# Patient Record
Sex: Male | Born: 1960
Health system: Southern US, Community
[De-identification: ages and names within clinical notes are randomized; demographics above are authoritative.]

## PROBLEM LIST (undated history)

## (undated) DIAGNOSIS — E119 Type 2 diabetes mellitus without complications: Secondary | ICD-10-CM

## (undated) DIAGNOSIS — Z973 Presence of spectacles and contact lenses: Secondary | ICD-10-CM

## (undated) DIAGNOSIS — M1990 Unspecified osteoarthritis, unspecified site: Secondary | ICD-10-CM

## (undated) DIAGNOSIS — Z8739 Personal history of other diseases of the musculoskeletal system and connective tissue: Secondary | ICD-10-CM

## (undated) DIAGNOSIS — G4730 Sleep apnea, unspecified: Secondary | ICD-10-CM

## (undated) DIAGNOSIS — Z8709 Personal history of other diseases of the respiratory system: Secondary | ICD-10-CM

## (undated) DIAGNOSIS — R0902 Hypoxemia: Secondary | ICD-10-CM

## (undated) DIAGNOSIS — T7840XA Allergy, unspecified, initial encounter: Secondary | ICD-10-CM

## (undated) HISTORY — DX: Personal history of other diseases of the respiratory system: Z87.09

## (undated) HISTORY — DX: Presence of spectacles and contact lenses: Z97.3

## (undated) HISTORY — DX: Unspecified osteoarthritis, unspecified site: M19.90

## (undated) HISTORY — PX: HARDWARE REMOVAL: SHX979

## (undated) HISTORY — DX: Personal history of other diseases of the musculoskeletal system and connective tissue: Z87.39

## (undated) HISTORY — DX: Allergy, unspecified, initial encounter: T78.40XA

## (undated) HISTORY — DX: Hypoxemia: R09.02

## (undated) HISTORY — DX: Type 2 diabetes mellitus without complications: E11.9

## (undated) HISTORY — PX: HERNIA REPAIR: SHX51

## (undated) HISTORY — PX: OTHER SURGICAL HISTORY: SHX169

## (undated) HISTORY — PX: FRACTURE SURGERY: SHX138

## (undated) HISTORY — DX: Sleep apnea, unspecified: G47.30

---

## 2004-02-06 ENCOUNTER — Emergency Department (HOSPITAL_COMMUNITY): Admission: EM | Admit: 2004-02-06 | Discharge: 2004-02-06 | Payer: Self-pay | Admitting: Emergency Medicine

## 2005-10-02 IMAGING — CR DG SHOULDER 2+V*R*
4 series · 4 of 4 positions shown · non-contrast
Comparison: none

CLINICAL DATA: Right shoulder pain following a fall.

RIGHT SHOULDER - 4 VIEW

[view not recorded (1 of 4)]
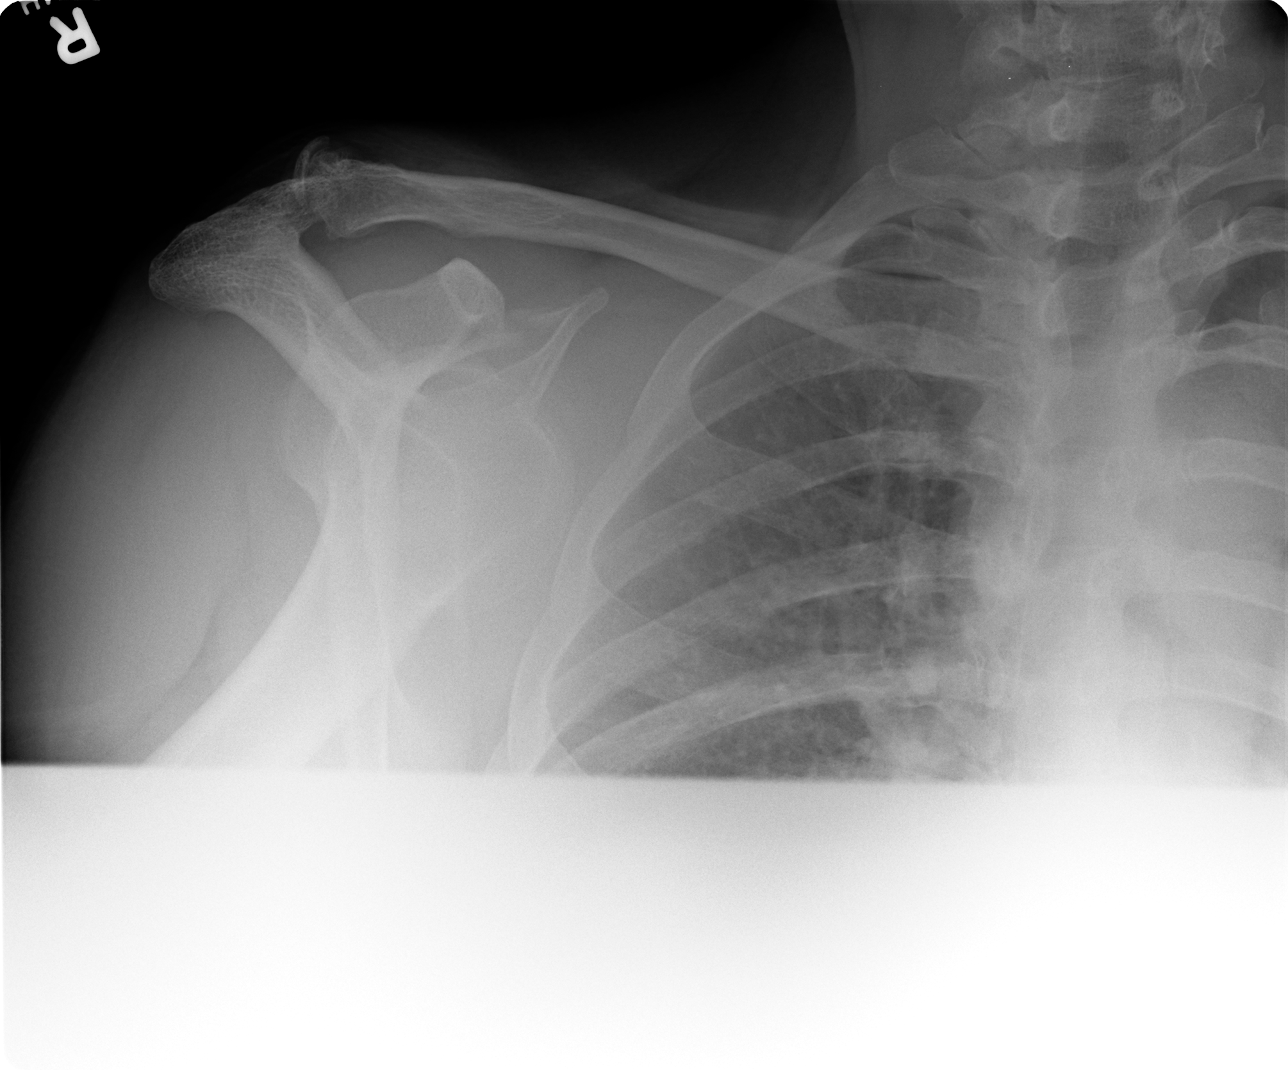

[view not recorded (2 of 4)]
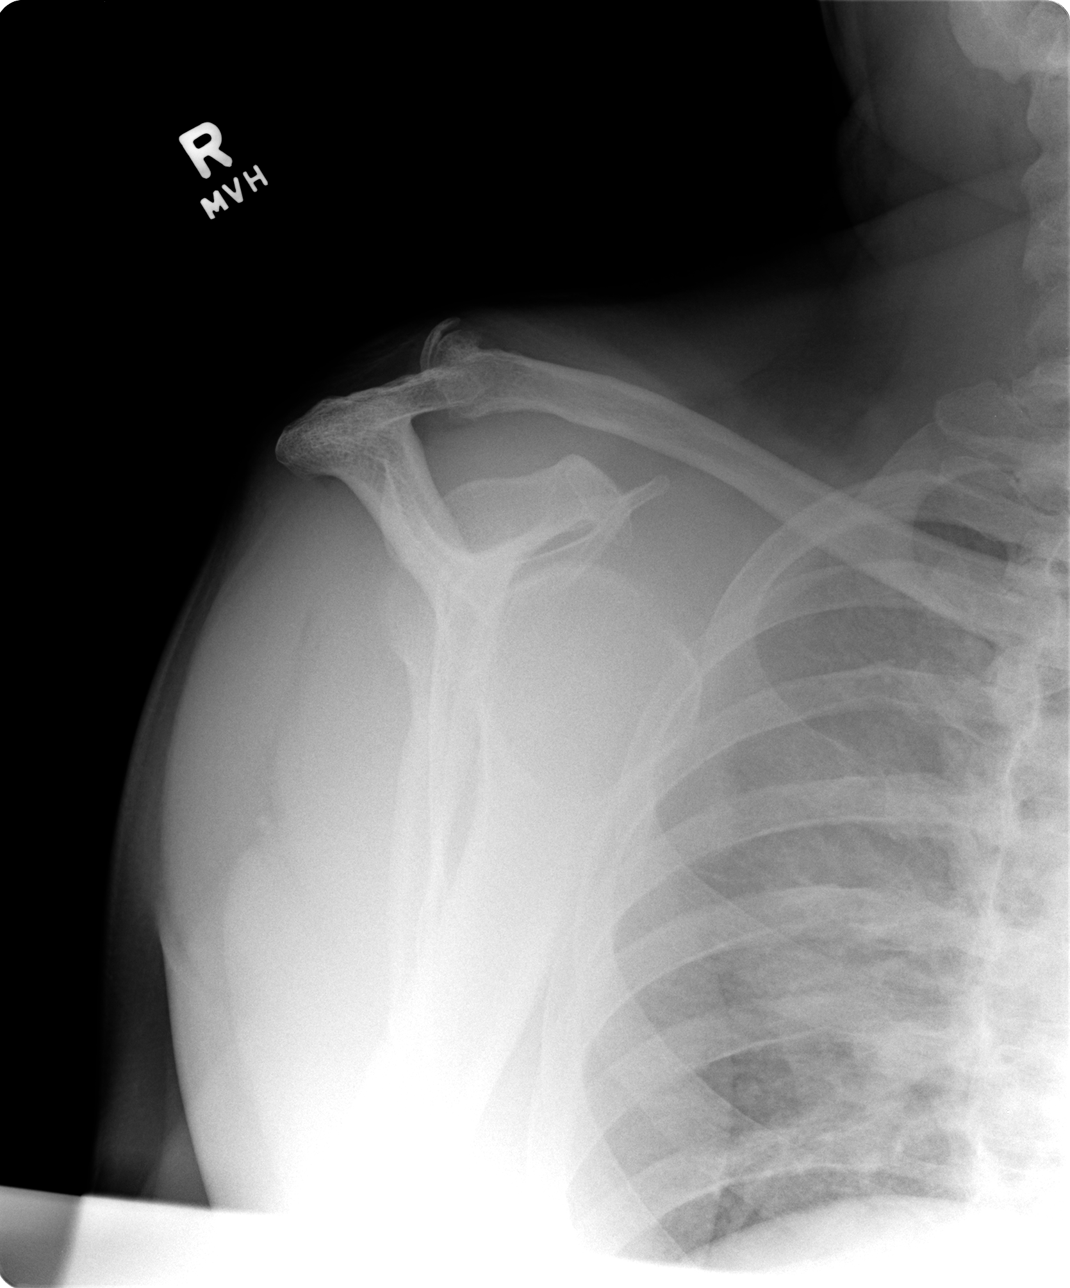

[view not recorded (3 of 4)]
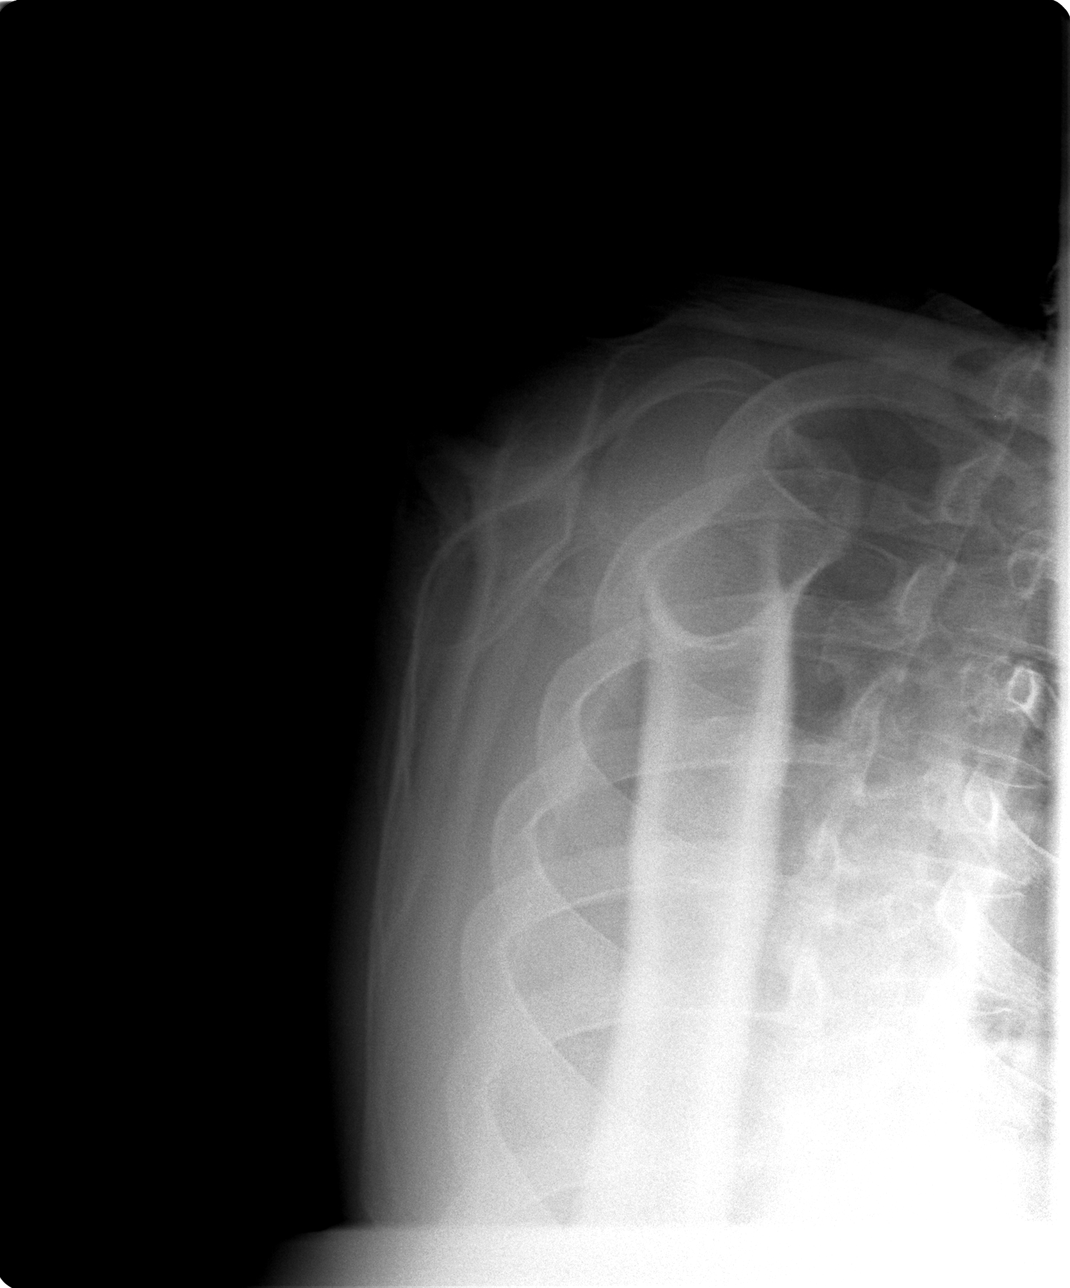

[view not recorded (4 of 4)]
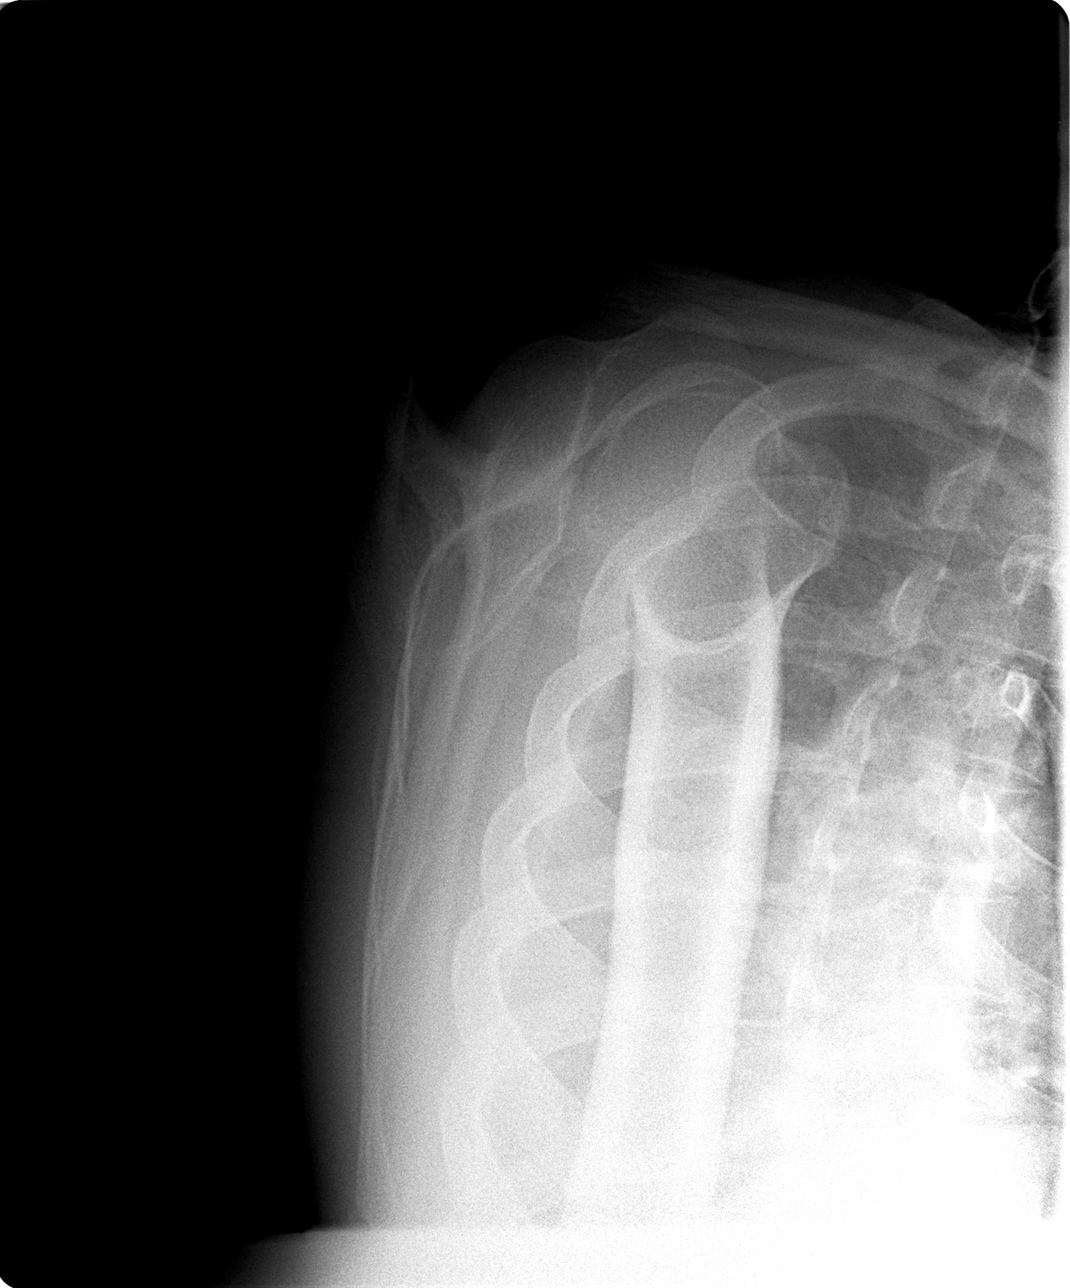

[4 of 4 positions shown; findings below may reference images not displayed]

FINDINGS: Anterior dislocation of the humeral head relative to the glenoid. No visible fracture.
Degenerative or old posttraumatic changes at the AC joint.

IMPRESSION

Anterior dislocation. No visible fracture.

## 2005-11-21 ENCOUNTER — Emergency Department: Payer: Self-pay | Admitting: Internal Medicine

## 2009-07-21 ENCOUNTER — Encounter: Payer: Self-pay | Admitting: Family Medicine

## 2009-07-25 ENCOUNTER — Encounter: Payer: Self-pay | Admitting: Family Medicine

## 2009-08-22 ENCOUNTER — Encounter: Payer: Self-pay | Admitting: Family Medicine

## 2009-08-31 ENCOUNTER — Ambulatory Visit: Payer: Self-pay | Admitting: Family Medicine

## 2009-08-31 ENCOUNTER — Encounter (INDEPENDENT_AMBULATORY_CARE_PROVIDER_SITE_OTHER): Payer: Self-pay | Admitting: *Deleted

## 2009-08-31 DIAGNOSIS — J339 Nasal polyp, unspecified: Secondary | ICD-10-CM | POA: Insufficient documentation

## 2009-08-31 DIAGNOSIS — G4730 Sleep apnea, unspecified: Secondary | ICD-10-CM | POA: Insufficient documentation

## 2009-08-31 DIAGNOSIS — M109 Gout, unspecified: Secondary | ICD-10-CM

## 2009-08-31 DIAGNOSIS — J309 Allergic rhinitis, unspecified: Secondary | ICD-10-CM | POA: Insufficient documentation

## 2009-08-31 DIAGNOSIS — G4733 Obstructive sleep apnea (adult) (pediatric): Secondary | ICD-10-CM | POA: Insufficient documentation

## 2009-09-08 LAB — CONVERTED CEMR LAB
ALT: 21 units/L (ref 0–53)
AST: 22 units/L (ref 0–37)
Albumin: 4.3 g/dL (ref 3.5–5.2)
Alkaline Phosphatase: 94 units/L (ref 39–117)
BUN: 14 mg/dL (ref 6–23)
Bilirubin, Direct: 0.1 mg/dL (ref 0.0–0.3)
CO2: 32 meq/L (ref 19–32)
Calcium: 9.6 mg/dL (ref 8.4–10.5)
Chloride: 101 meq/L (ref 96–112)
Cholesterol: 225 mg/dL — ABNORMAL HIGH (ref 0–200)
Creatinine, Ser: 1 mg/dL (ref 0.4–1.5)
Direct LDL: 123.5 mg/dL
GFR calc non Af Amer: 86.04 mL/min (ref >60)
Glucose, Bld: 90 mg/dL (ref 70–99)
HDL: 41.1 mg/dL (ref >39.00)
Potassium: 4 meq/L (ref 3.5–5.1)
Sodium: 142 meq/L (ref 135–145)
Total Bilirubin: 0.8 mg/dL (ref 0.3–1.2)
Total CHOL/HDL Ratio: 5
Total Protein: 6.9 g/dL (ref 6.0–8.3)
Triglycerides: 350 mg/dL — ABNORMAL HIGH (ref 0.0–149.0)
Uric Acid, Serum: 7.4 mg/dL (ref 4.0–7.8)
VLDL: 70 mg/dL — ABNORMAL HIGH (ref 0.0–40.0)

## 2009-09-21 ENCOUNTER — Telehealth: Payer: Self-pay | Admitting: Family Medicine

## 2009-10-06 ENCOUNTER — Ambulatory Visit: Payer: Self-pay | Admitting: Family Medicine

## 2009-10-06 DIAGNOSIS — M5380 Other specified dorsopathies, site unspecified: Secondary | ICD-10-CM

## 2009-10-07 LAB — CONVERTED CEMR LAB: Uric Acid, Serum: 5.8 mg/dL (ref 4.0–7.8)

## 2009-11-28 ENCOUNTER — Telehealth: Payer: Self-pay | Admitting: Family Medicine

## 2010-02-17 ENCOUNTER — Ambulatory Visit: Payer: Self-pay | Admitting: Family Medicine

## 2010-02-20 ENCOUNTER — Encounter: Payer: Self-pay | Admitting: Family Medicine

## 2010-02-20 LAB — CONVERTED CEMR LAB: Uric Acid, Serum: 7 mg/dL (ref 4.0–7.8)

## 2010-05-23 NOTE — Letter (Signed)
Summary: Out of Work  [facility] [employer] [facility]  [address] [place], [place] [address]   Phone: [phone]  Fax: [phone]    October 06, 2009   Employee:  Fadil Bolding    To Whom It May Concern:   For Medical reasons, please excuse the above named employee from work for the following dates:  Start:   10/06/2009  End:   10/08/2009  If you need additional information, please feel free to contact our office.         Sincerely,    Ruthe Mannan MD

## 2010-05-23 NOTE — Assessment & Plan Note (Signed)
Summary: NEW PATIENT, EST.   Vital Signs:  Patient profile:   50 year old male Height:      68 inches Weight:      210.25 pounds BMI:     32.08 Temp:     97.7 degrees F oral Pulse rate:   84 / minute Pulse rhythm:   regular BP sitting:   126 / 90  (left arm) Cuff size:   large  Vitals Entered By: Delilah Shan CMA Duncan Dull) (09-03-09 10:35 AM) CC: New Patient to Establish   History of Present Illness: 50 yo here to establish care.  Sees Dr. Sueanne Margarita) for gout, otherwise healthy.  Gout- does not have med list with him but knows that he is Allopurinol 100 mg daily and another pill (likely colchice).  Gout flares typically in ankles, sometimes feet. Has been relatively quiet lately.  Nasal polyps- has severe nasal polyps, seeing ENT.  Not sure if insurance will cover surgery.  Sleep apnea- wears CPap at night but cannot use lately because of severe nasal obstruction from polyps and congestion.  Seasonal rhinitis- worsened by polyps.  Has tried numerous nasal sprays and oral meds, nothing helps.  Well man- not sure last time he had lipids or renal function checked.  Preventive Screening-Counseling & Management  Alcohol-Tobacco     Smoking Status: never  Caffeine-Diet-Exercise     Does Patient Exercise: no      Drug Use:  no.    Current Medications (verified): 1)  Allopurinol 100 Mg Tabs (Allopurinol) .... Take 1 Tablet By Mouth Once A Day 2)  Colchicine-Probenecid 0.5-500 Mg Tabs (Colchicine-Probenecid) .... ? 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  Family History: Reviewed history and no changes required. Both parents died young- mom had brittle DM, dad ETOH  Social History: Reviewed history and no changes required. 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 Eyes:  Denies blurring. ENT:  Denies difficulty swallowing. CV:  Denies chest pain or discomfort and difficulty breathing at night. Resp:  Denies shortness of breath. GI:  Denies abdominal pain, bloody stools, and change in bowel habits. GU:  Denies discharge, dysuria, and erectile dysfunction. MS:  Denies joint pain, joint redness, and joint swelling. Derm:  Denies rash. Neuro:  Denies headaches and visual disturbances. Psych:  Denies anxiety and depression. Endo:  Denies cold intolerance, heat intolerance, and weight change. Heme:  Denies abnormal bruising and bleeding. Allergy:  Complains of seasonal allergies; denies hives or rash, itching eyes, persistent infections, and sneezing.  Physical Exam  General:  Well-developed,well-nourished,in no acute distress; alert,appropriate and cooperative throughout examination Head:  normocephalic, atraumatic, and no abnormalities observed.   Eyes:  vision grossly intact, pupils equal, and pupils round.   Ears:  R ear normal and L ear normal.   Nose:  no external deformity.  Mouth:  good dentition.   Lungs:  Normal respiratory effort, chest expands symmetrically. Lungs are clear to auscultation, no crackles or wheezes. Heart:  Normal rate and regular rhythm. S1 and S2 normal without gallop, murmur, click, rub or other extra sounds. Abdomen:  soft and non-tender.   Extremities:  no edema Neurologic:   alert & oriented X3.   Skin:  Intact without suspicious lesions or rashes Psych:  Cognition and judgment appear intact. Alert and cooperative with normal attention span and concentration. No apparent delusions, illusions, hallucinations   Impression & Recommendations:  Problem # 1:  GOUT (ICD-274.9) Assessment Unchanged Awaiting records and med list. recheck uric acid and renal funciton. ?possible uloric as a treatment option. His updated medication list for this problem includes:    Allopurinol 100 Mg Tabs (Allopurinol) .Marland Kitchen... Take 1 tablet by mouth once a day    Colchicine-probenecid 0.5-500 Mg Tabs (Colchicine-probenecid) ..... ? dosage  Problem # 2:  ALLERGIC RHINITIS (ICD-477.9) Assessment: Deteriorated Awaiitng records, not willing to try another medication. Deteriorated due to polyps  Problem # 3:  UNSPECIFIED NASAL POLYP (ICD-471.9) Assessment: Unchanged Now affecting his ability to use CPAP.  Strongly urged him to get the surgery.  Problem # 4:  SLEEP APNEA (ICD-780.57) Assessment: Unchanged See above.  Complete Medication List: 1)  Allopurinol 100 Mg Tabs (Allopurinol) .... Take 1 tablet by mouth once a day 2)  Colchicine-probenecid 0.5-500 Mg Tabs (Colchicine-probenecid) .... ? dosage  Other Orders: Venipuncture (47829) TLB-BMP (Basic Metabolic Panel-BMET) (80048-METABOL) TLB-Hepatic/Liver Function Pnl (80076-HEPATIC) TLB-Lipid Panel (80061-LIPID) TLB-Uric Acid, Blood (84550-URIC) Tdap => 80yrs IM (56213) Admin 1st Vaccine (08657)  Patient Instructions: 1)  Great to meet you, Mr. Schnitker. 2)  Let's draw some labs today. 3)  We will get your records from rheumatology and take it from there.  Current Allergies (reviewed today): No known allergies     Immunizations Administered:  Tetanus Vaccine:    Vaccine Type: Tdap    Site: left deltoid    Mfr: GlaxoSmithKline    Dose: 0.5 ml    Route: IM    Given by: Delilah Shan CMA (AAMA)    Exp. Date:  07/16/2011    Lot #: [id number]    VIS given: 03/11/07 version given Aug 31, 2009.

## 2010-05-23 NOTE — Miscellaneous (Signed)
Summary: med list update  Clinical Lists Changes  Medications: Changed medication from COLCHICINE-PROBENECID 0.5-500 MG TABS (COLCHICINE-PROBENECID) ? dosage to COLCRYS 0.6 MG TABS (COLCHICINE) take one by mouth daily     Prior Medications: ALLOPURINOL 100 MG TABS (ALLOPURINOL) Take 1 tablet by mouth once a day COLCRYS 0.6 MG TABS (COLCHICINE) take one by mouth daily Current Allergies: No known allergies

## 2010-05-23 NOTE — Letter (Signed)
Summary: Rheumatology/[facility]  Rheumatology/[facility]   Imported By: Lester [place] 10/08/2009 10:09:27  _____________________________________________________________________  External Attachment:    Type:   Image     Comment:   External Document

## 2010-05-23 NOTE — Assessment & Plan Note (Signed)
Summary: BACK PAIN / LFW   Vital Signs:  Patient profile:   50 year old male Height:      68 inches Weight:      218 pounds BMI:     33.27 Temp:     97.6 degrees F oral Pulse rate:   76 / minute Pulse rhythm:   regular BP sitting:   138 / 90  (left arm) Cuff size:   large  Vitals Entered By: Linde Gillis CMA Duncan Dull) (October 06, 2009 2:21 PM) CC: back pain   History of Present Illness: 50 yo here for follow up urgent care.  Back pain- Was lifting something at work Monday, that night had tightness in his mid back area. No tingling or weakness down his legs.   left sides hurts more than right side.  Went to urgent care Monday night, given cyclobenzaprine. It does help some, feels his symptoms have improved slightly.  No urinary symptoms.   Gout-stopped taking his Colcrys, causing too much GI upset.  Increased his Allopurinol per rheumatology recs.  Current Medications (verified): 1)  Allopurinol 100 Mg Tabs (Allopurinol) .... Take 2  Tablet By Mouth Once A Day 2)  Colcrys 0.6 Mg Tabs (Colchicine) .... Take One By Mouth Daily 3)  Mens Multivitamin Plus  Tabs (Multiple Vitamins-Minerals) .... Take One Tablet By Mouth Daily 4)  Garlic Oil 1000 Mg Caps (Garlic) .... Take One Tablet By Mouth Two Times A Day 5)  Ginkgo Biloba 60 Mg Caps (Ginkgo Biloba) .... Take One Tablet By Mouth Two Times A Day 6)  Super B Complex  Tabs (B Complex-C) .... Take One Tablet By Mouth Daily 7)  Ibuprofen 800 Mg Tabs (Ibuprofen) .... Take One Tablet By Mouth Every 3-4 Hours As Needed Pain 8)  Cyclobenzaprine Hcl 5 Mg Tabs (Cyclobenzaprine Hcl) .... Take One Tablet By Mouth 3-4 Times Daily As Needed Pain  Allergies (verified): No Known Drug Allergies  Past History:  Past Medical History: Last updated: 22-Sep-2009 Gout Allergic rhinitis Sleep apnea- wears CPAP  Past Surgical History: Last updated: 09/22/09 Multle work injuries- broken leg, feet, had steel rod in left leg (removed).  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 GU:  Denies dysuria, incontinence, urinary frequency, and urinary hesitancy. MS:  Complains of mid back pain; denies muscle weakness.  Physical Exam  General:  Well-developed,well-nourished,in no acute distress; alert,appropriate and cooperative throughout examination Msk:  thoracic paraspinous musles tight, non tender to palpation. SLR neg bilaterally. Neurologic:  alert & oriented X3 and gait normal.   Psych:  Cognition and judgment appear intact. Alert and cooperative with normal attention span and concentration. No apparent delusions, illusions, hallucinations   Impression & Recommendations:  Problem # 1:  MUSCLE SPASM, BACK (ICD-724.8) Assessment New continue cyclobenzaprine, advised staying active. note excuse from work given to patient today. His updated medication list for this problem includes:    Ibuprofen 800 Mg Tabs (Ibuprofen) .Marland Kitchen... Take one tablet by mouth every 3-4 hours as needed pain    Cyclobenzaprine Hcl 5 Mg Tabs (Cyclobenzaprine hcl) .Marland Kitchen... Take one tablet by mouth 3-4 times daily as needed pain  Problem # 2:  GOUT (ICD-274.9) Assessment: Unchanged  Ok with increasing Allopurinol and stopping Colcrys.  Recheck uric acid today.  Will call pt if levels remain elevated.  May need additional medication adjustment. The following medications were removed from the medication list:    Colcrys 0.6 Mg Tabs (Colchicine) .Marland Kitchen... Take one by mouth daily His updated medication list for this problem includes:    Allopurinol 100 Mg Tabs (Allopurinol) .Marland Kitchen... Take 2  tablet by mouth once a day     Ibuprofen 800 Mg Tabs (Ibuprofen) .Marland Kitchen... Take one tablet by mouth every 3-4 hours as needed pain  Orders: Venipuncture (24401) TLB-Uric Acid, Blood (84550-URIC)  Complete Medication List: 1)  Allopurinol 100 Mg Tabs (Allopurinol) .... Take 2  tablet by mouth once a day 2)  Mens Multivitamin Plus Tabs (Multiple vitamins-minerals) .... Take one tablet by mouth daily 3)  Garlic Oil 1000 Mg Caps (Garlic) .... Take one tablet by mouth two times a day 4)  Ginkgo Biloba 60 Mg Caps (Ginkgo biloba) .... Take one tablet by mouth two times a day 5)  Super B Complex Tabs (B complex-c) .... Take one tablet by mouth daily 6)  Ibuprofen 800 Mg Tabs (Ibuprofen) .... Take one tablet by mouth every 3-4 hours as needed pain 7)  Cyclobenzaprine Hcl 5 Mg Tabs (Cyclobenzaprine hcl) .... Take one tablet by mouth 3-4 times daily as needed pain Prescriptions: ALLOPURINOL 100 MG TABS (ALLOPURINOL) Take 2  tablet by mouth once a day  #60 x 3   Entered and Authorized by:   Ruthe Mannan MD   Signed by:   Ruthe Mannan MD on 10/06/2009   Method used:   Electronically to        [facility] [address] (retail)       [address], [address]       [place], [place]  [address]       Ph: [phone]       Fax: [phone]   RxID:   [id number]   Current Allergies (reviewed today): No known allergies

## 2010-05-23 NOTE — Letter (Signed)
Summary: Rheumatology/[facility]  Rheumatology/[facility]   Imported By: Lester [place] 10/08/2009 10:10:30  _____________________________________________________________________  External Attachment:    Type:   Image     Comment:   External Document

## 2010-05-23 NOTE — Letter (Signed)
Summary: Rheumatology/[facility]  Rheumatology/[facility]   Imported By: Lester [place] 10/08/2009 10:07:26  _____________________________________________________________________  External Attachment:    Type:   Image     Comment:   External Document

## 2010-05-23 NOTE — Letter (Signed)
Summary: Generic Letter  [facility] at [facility]  [address] [place], [place] [address]   Phone: [phone]  Fax: [phone]    02/20/2010  [facility] [address] [place], [place]  [address]  Dear Mr. BASWELL,     We have received your lab results and Dr. Dayton Martes says your uric acid is within normal limits. Enclosed is a copy of your lab results.      Sincerely,        Linde Gillis CMA (AAMA)for Dr. Ruthe Mannan

## 2010-05-23 NOTE — Assessment & Plan Note (Signed)
Summary: RECHECK ON GOUT/DLO   Vital Signs:  Patient profile:   50 year old male Height:      68 inches Weight:      208 pounds BMI:     31.74 Temp:     98.2 degrees F oral Pulse rate:   76 / minute Pulse rhythm:   regular BP sitting:   126 / 84  (right arm) Cuff size:   regular  Vitals Entered By: Linde Gillis CMA Duncan Dull) (February 17, 2010 12:01 PM) CC: recheck gout   History of Present Illness: Gout- on Allopurinol 100 mg daily and colchicine as needed.   Gout flares typically in ankles, sometimes feet and wrists. Had a flare two weeks ago after eating red meat the night before. Taking Ibuprofen and colchicine when he has a flare, usually works pretty well. Last uric acid was 5.8 in June.     Current Medications (verified): 1)  Mens Multivitamin Plus  Tabs (Multiple Vitamins-Minerals) .... Take One Tablet By Mouth Daily 2)  Garlic Oil 1000 Mg Caps (Garlic) .... Take One Tablet By Mouth Two Times A Day 3)  Ginkgo Biloba 60 Mg Caps (Ginkgo Biloba) .... Take One Tablet By Mouth Two Times A Day 4)  Super B Complex  Tabs (B Complex-C) .... Take One Tablet By Mouth Daily 5)  Ibuprofen 800 Mg Tabs (Ibuprofen) .... Take One Tablet By Mouth Every 3-4 Hours As Needed Pain 6)  Cyclobenzaprine Hcl 5 Mg Tabs (Cyclobenzaprine Hcl) .... Take One Tablet By Mouth 3-4 Times Daily As Needed Pain 7)  Colchicine 0.6 Mg Tabs (Colchicine) .... 2 Tab Po At First Sign of Flare, Followed By 1 Tab Po in 1 Hour, Then 2 Tabs By Mouth Two Times A Day. 8)  Vitamin C 500 Mg Tabs (Ascorbic Acid) .... Take One Tablet By Mouth Daily 9)  Ginger Root 550 Mg Caps (Ginger (Zingiber Officinalis)) .... Take One Tablet By Mouth Daily 10)  Cranberry 500 Mg Caps (Cranberry) .... Take One Tablet By Mouth Daily 11)  Uloric 40 Mg Tabs (Febuxostat) .Marland Kitchen.. 1 Tab By Mouth Daily.  Allergies (verified): No Known Drug Allergies  Past History:  Past Medical History: Last updated: Sep 17, 2009 Gout Allergic rhinitis Sleep  apnea- wears CPAP  Past Surgical History: Last updated: 09-17-09 Multle work injuries- broken leg, feet, had steel rod in left leg (removed).  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 MS:  Complains of joint pain, joint redness, and joint swelling.  Physical Exam  General:  Well-developed,well-nourished,in no acute distress; alert,appropriate and cooperative throughout examination Mouth:  good dentition.   Msk:  No deformity or scoliosis noted of thoracic or lumbar spine.   No joint swelling visible, FROM.   Extremities:  no edema Neurologic:  alert & oriented X3 and gait normal.   Psych:  Cognition and judgment appear intact. Alert and cooperative with normal attention span and concentration. No apparent delusions, illusions, hallucinations   Impression & Recommendations:  Problem # 1:  GOUT (ICD-274.9) Assessment Deteriorated Time spent with patient 25 minutes, more than 50% of this time was spent counseling patient on gout.  He wants to try Uloric since he has had a  few more flares lately.  Will recheck uric acid today.  The following medications were removed from the medication list:    Allopurinol 100 Mg Tabs (Allopurinol) .Marland Kitchen... Take 2  tablet by mouth once a day His updated medication list for this problem includes:    Ibuprofen 800 Mg Tabs (Ibuprofen) .Marland Kitchen... Take one tablet by mouth every 3-4 hours as needed pain    Colchicine 0.6 Mg Tabs (Colchicine) .Marland Kitchen... 2 tab po at first sign of flare, followed by 1 tab po in 1 hour, then 2 tabs by mouth two times a day.    Uloric 40 Mg Tabs (Febuxostat) .Marland Kitchen... 1 tab by mouth  daily.  Orders: Venipuncture (04540) TLB-Uric Acid, Blood (84550-URIC)  Complete Medication List: 1)  Mens Multivitamin Plus Tabs (Multiple vitamins-minerals) .... Take one tablet by mouth daily 2)  Garlic Oil 1000 Mg Caps (Garlic) .... Take one tablet by mouth two times a day 3)  Ginkgo Biloba 60 Mg Caps (Ginkgo biloba) .... Take one tablet by mouth two times a day 4)  Super B Complex Tabs (B complex-c) .... Take one tablet by mouth daily 5)  Ibuprofen 800 Mg Tabs (Ibuprofen) .... Take one tablet by mouth every 3-4 hours as needed pain 6)  Cyclobenzaprine Hcl 5 Mg Tabs (Cyclobenzaprine hcl) .... Take one tablet by mouth 3-4 times daily as needed pain 7)  Colchicine 0.6 Mg Tabs (Colchicine) .... 2 tab po at first sign of flare, followed by 1 tab po in 1 hour, then 2 tabs by mouth two times a day. 8)  Vitamin C 500 Mg Tabs (Ascorbic acid) .... Take one tablet by mouth daily 9)  Ginger Root 550 Mg Caps (Ginger (zingiber officinalis)) .... Take one tablet by mouth daily 10)  Cranberry 500 Mg Caps (Cranberry) .... Take one tablet by mouth daily 11)  Uloric 40 Mg Tabs (Febuxostat) .Marland Kitchen.. 1 tab by mouth daily.  Patient Instructions: 1)  Please stop taking the Allopurinol. 2)  Start Uloric.   Prescriptions: ULORIC 40 MG TABS (FEBUXOSTAT) 1 tab by mouth daily.  #30 x 3   Entered and Authorized by:   Ruthe Mannan MD   Signed by:   Ruthe Mannan MD on 02/17/2010   Method used:   Electronically to        [facility] [address] (retail)       [address], [address]       [place], [place]  [address]       Ph: [phone]       Fax: [phone]   RxID:   [id number]    Orders Added: 1)  Venipuncture [72536] 2)  TLB-Uric Acid, Blood [84550-URIC] 3)  Est. Patient Level IV [64403]    Current Allergies (reviewed today): No known allergies

## 2010-05-23 NOTE — Progress Notes (Signed)
Summary: regarding gout  Phone Note Call from Patient Call back at Home Phone [phone]   Caller: Patient Call For: Ruthe Mannan MD Summary of Call: Pt was seeing a rheumatologist for gout before he came here.  He has appt there tomorrow but he is asking your opinion on whether he should keep that appt or just let you follow his gout.                 Lowella Petties CMA  September 21, 2009 9:45 AM   Follow-up for Phone Call        that is up to him.  i can manage gout if he would prefer. Ruthe Mannan MD  September 21, 2009 9:54 AM  Called patient on the number listed, says wireless customer is not available please try your call again later.  Will call back later.  Linde Gillis CMA Duncan Dull)  September 21, 2009 10:00 AM   Called patient on the number listed, says wireless customer is not available please try your call again later.  Will call back later.  Linde Gillis CMA Duncan Dull)  September 21, 2009 11:43 AM   Called patient and the message on cell phone says that person you are trying to reach is not accepting calls at this time.  Will wait for patient to call back. Follow-up by: Linde Gillis CMA Duncan Dull),  September 21, 2009 4:10 PM

## 2010-05-23 NOTE — Progress Notes (Signed)
Summary: wants something for gout  Phone Note Call from Patient Call back at Home Phone [phone]   Caller: Patient Summary of Call: Pt is asking for something for his gout to take in additon to allopurinol.  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  He has a flare up in several joints now.  He couldnt take the colchicine because it caused diarrhea.   Uses [facility] [address]. Initial call taken by: Lowella Petties CMA,  November 28, 2009 12:05 PM  Follow-up for Phone Call        Unfortunately allopurinol can worsen acute flares.  He needs to be on colchcine even if it does cause diarrhea and possibly prednisone if the colchicine does not help.   Ruthe Mannan MD  November 28, 2009 12:19 PM     New/Updated Medications: COLCHICINE 0.6 MG TABS (COLCHICINE) 2 tab po at first sign of flare, followed by 1 tab po in 1 hour, then 2 tabs by mouth two times a day. Prescriptions: COLCHICINE 0.6 MG TABS (COLCHICINE) 2 tab po at first sign of flare, followed by 1 tab po in 1 hour, then 2 tabs by mouth two times a day.  #90 x 0   Entered and Authorized by:   Ruthe Mannan MD   Signed by:   Ruthe Mannan MD on 11/28/2009   Method used:   Electronically to        [facility] [address] (retail)       [address], [address]       [place], [place]  [address]       Ph: [phone]       Fax: [phone]   RxID:   [id number] COLCHICINE 0.6 MG TABS (COLCHICINE) 2 tab po at first sign of flare, followed by 1 tab po in 1 hour, then 2 tabs by mouth two times a day.  #90 x 0   Entered and Authorized by:   Ruthe Mannan MD   Signed by:   Ruthe Mannan MD on 11/28/2009   Method used:   [facility]ron[facility]        [facility]       [address]       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  Appended Document: wants something for gout Can he come in to see me?  Appended Document: wants something for gout Pt is on the road and not able to come in for a visit.  He said he will try the colchicine again and see how he does.

## 2010-06-06 ENCOUNTER — Ambulatory Visit: Payer: Self-pay | Admitting: Otolaryngology

## 2010-06-21 ENCOUNTER — Telehealth: Payer: Self-pay | Admitting: Family Medicine

## 2010-06-22 ENCOUNTER — Encounter (INDEPENDENT_AMBULATORY_CARE_PROVIDER_SITE_OTHER): Payer: Self-pay | Admitting: *Deleted

## 2010-06-29 NOTE — Miscellaneous (Signed)
Summary: med list update- colcrys  Clinical Lists Changes  Medications: Added new medication of COLCRYS 0.6 MG TABS (COLCHICINE) 2 tabs by mouth at first sign of flare, followed by 1 tablet by mouth in 1 hour, then 2 tabs by mouth two times a day Removed medication of COLCHICINE 0.6 MG TABS (COLCHICINE) 2 tab po at first sign of flare, followed by 1 tab po in 1 hour, then 2 tabs by mouth two times a day.     Prior Medications: MENS MULTIVITAMIN PLUS  TABS (MULTIPLE VITAMINS-MINERALS) take one tablet by mouth daily GARLIC OIL 1000 MG CAPS (GARLIC) take one tablet by mouth two times a day GINKGO BILOBA 60 MG CAPS (GINKGO BILOBA) take one tablet by mouth two times a day SUPER B COMPLEX  TABS (B COMPLEX-C) take one tablet by mouth daily IBUPROFEN 800 MG TABS (IBUPROFEN) take one tablet by mouth every 3-4 hours as needed pain CYCLOBENZAPRINE HCL 5 MG TABS (CYCLOBENZAPRINE HCL) take one tablet by mouth 3-4 times daily as needed pain VITAMIN C 500 MG TABS (ASCORBIC ACID) take one tablet by mouth daily GINGER ROOT 550 MG CAPS (GINGER (ZINGIBER OFFICINALIS)) take one tablet by mouth daily CRANBERRY 500 MG CAPS (CRANBERRY) take one tablet by mouth daily ULORIC 40 MG TABS (FEBUXOSTAT) 1 tab by mouth daily. Current Allergies: No known allergies

## 2010-06-29 NOTE — Progress Notes (Signed)
Summary: refill request for colcrys  Phone Note Refill Request Call back at [phone] Message from:  wife  Refills Requested: Medication #1:  COLCHICINE 0.6 MG TABS 2 tab po at first sign of flare Pt uses colcry 0.6 mg now, needs a refill, previously prescribed by his rheumatologist.  [facility]ses [facility] [address].  Initial call taken by: Lowella Petties CMA, AAMA,  June 21, 2010 4:32 PM    Prescriptions: COLCHICINE 0.6 MG TABS (COLCHICINE) 2 tab po at first sign of flare, followed by 1 tab po in 1 hour, then 2 tabs by mouth two times a day.  #90 x 0   Entered and Authorized by:   Ruthe Mannan MD   Signed by:   Ruthe Mannan MD on 06/22/2010   Method used:   Electronically to        [facility] [address] (retail)       [address]       [place], [place]  [address]       Ph: [phone]       Fax: [phone]   RxID:   [id number] COLCHICINE 0.6 MG TABS (COLCHICINE) 2 tab po at first sign of flare, followed by 1 tab po in 1 hour, then 2 tabs by mouth two times a day.  #90 x 0   Entered and Authorized by:   Ruthe Mannan MD   Signed by:   Ruthe Mannan MD on 06/22/2010   Method used:   Electronically to        [facility] (retail)       [address]       [place], [place]  [address]       Ph: [phone]       Fax: [phone]   RxID:   [id number]

## 2010-09-15 ENCOUNTER — Other Ambulatory Visit: Payer: Self-pay | Admitting: Family Medicine

## 2010-09-16 NOTE — Telephone Encounter (Signed)
Is it okay to refill this? Does patient need to schedule an annual physical?

## 2010-09-19 NOTE — Telephone Encounter (Signed)
Rx called to pharmacy

## 2011-12-31 ENCOUNTER — Encounter: Payer: Self-pay | Admitting: Family Medicine

## 2011-12-31 ENCOUNTER — Ambulatory Visit (INDEPENDENT_AMBULATORY_CARE_PROVIDER_SITE_OTHER): Payer: 59 | Admitting: Family Medicine

## 2011-12-31 ENCOUNTER — Telehealth: Payer: Self-pay | Admitting: *Deleted

## 2011-12-31 VITALS — BP 126/82 | HR 82 | Temp 97.9°F | Wt 203.2 lb

## 2011-12-31 DIAGNOSIS — M109 Gout, unspecified: Secondary | ICD-10-CM

## 2011-12-31 LAB — CBC WITH DIFFERENTIAL/PLATELET
Basophils Absolute: 0.1 10*3/uL (ref 0.0–0.1)
Basophils Relative: 1.1 % (ref 0.0–3.0)
Eosinophils Absolute: 0.2 10*3/uL (ref 0.0–0.7)
Eosinophils Relative: 2.9 % (ref 0.0–5.0)
HCT: 46.5 % (ref 39.0–52.0)
Hemoglobin: 15.6 g/dL (ref 13.0–17.0)
Lymphocytes Relative: 20.8 % (ref 12.0–46.0)
Lymphs Abs: 1.6 10*3/uL (ref 0.7–4.0)
MCHC: 33.5 g/dL (ref 30.0–36.0)
MCV: 91.9 fl (ref 78.0–100.0)
Monocytes Absolute: 0.8 10*3/uL (ref 0.1–1.0)
Monocytes Relative: 9.8 % (ref 3.0–12.0)
Neutro Abs: 5 10*3/uL (ref 1.4–7.7)
Neutrophils Relative %: 65.4 % (ref 43.0–77.0)
Platelets: 296 10*3/uL (ref 150.0–400.0)
RBC: 5.06 Mil/uL (ref 4.22–5.81)
RDW: 13.2 % (ref 11.5–14.6)
WBC: 7.7 10*3/uL (ref 4.5–10.5)

## 2011-12-31 LAB — URIC ACID: Uric Acid, Serum: 7.9 mg/dL — ABNORMAL HIGH (ref 4.0–7.8)

## 2011-12-31 MED ORDER — COLCHICINE 0.6 MG PO TABS: 0.6000 mg | ORAL_TABLET | Freq: Two times a day (BID) | ORAL | Status: DC | PRN

## 2011-12-31 MED ORDER — IBUPROFEN 600 MG PO TABS: 600.0000 mg | ORAL_TABLET | Freq: Three times a day (TID) | ORAL | Status: AC | PRN

## 2011-12-31 NOTE — Patient Instructions (Addendum)
I do think this is gout. Blood work today. Start ibuprofen 3-4 times daily with food. Start colchicine - take 2 pills today followed by 1 pill tonight if needed then take 1 pill twice daily as needed. After flare is better, restart daily medicine, may try ursinol daily.   If not helping, let us know.

## 2011-12-31 NOTE — Progress Notes (Signed)
  Subjective:    Patient ID: Paul Goodwin, male    DOB: Mar 18, 1961, 51 y.o.   MRN: [id number]  HPI CC: gout?  Recently changed diet 3 wks ago - healthier.  Cut out sodas, eating more wraps (truck driver), etc.    Prior on allopurinol 200mg  daily with colchicine prn.  Then was changed to uloric.  Self changed from uloric to ursinol (OTC herbal gout remedy) several months ago.  Had not had flare for several mo nths.  Last gout flare was 05/2011.   Stopped ursinol 3 wks ago because "I was doing so well"  5d ago started having presumed rpt gout flare.  Left ankle pain as well as some pain at MTP, as well as L elbow pain.  Trouble putting weight on foot.  Denies warmth or significant redness.  + swelling present.  H/o L ankle and foot injury remotely.  Denies inciting trauma.  Currently taking colchicine - but it's actually expired so unsure if it's helping.  Not taking NSAIDs.  Lab Results  Component Value Date   CREATININE 1.0 08/31/2009    No results found for this basename: URICACID    Wt Readings from Last 3 Encounters:  12/31/11 203 lb 4 oz (92.194 kg)  02/17/10 208 lb (94.348 kg)  10/06/09 218 lb (98.884 kg)    Review of Systems Per HPI    Objective:   Physical Exam  Nursing note and vitals reviewed. Constitutional: He appears well-developed and well-nourished. No distress.  Musculoskeletal: He exhibits no edema.       L elbow tender to palpation proximal medial to olecranon.  FROM L ankle - swollen, warm.  No erythema.  Tender to palpation lateral and medial ankle as well as dorsal ankle.  Neg squeeze test.  No pain at 1st MT.  Deformed foot at baseline - stiffness at 1st toe joints  Skin: Skin is warm and dry. No rash noted.  Psychiatric: He has a normal mood and affect.       Assessment & Plan:

## 2011-12-31 NOTE — Telephone Encounter (Signed)
Received faxed form from pharmacy showing that Colchicine 0.6 mg is no longer made. Pharmacist is requesting a new script for Colcrys. [facility]

## 2011-12-31 NOTE — Assessment & Plan Note (Signed)
Have asked to abstract. Presumed rpt gout flare in h/o same. Will treat with ibuprofen 600mg , colchicine (one at home was expired) and discussed restarting ppx medication (pt prefers to restart OTC ursinol) to prevent future treatments. Update [place] if not improving as expected, consider steroid course. Discussed allopurinol has evidence behind it but i'm not sure about ursinol. Out of work for 2 days.

## 2011-12-31 NOTE — Telephone Encounter (Signed)
[facility] notified

## 2011-12-31 NOTE — Telephone Encounter (Signed)
plz let them know that colcrys equivalent is fine.

## 2012-07-09 ENCOUNTER — Telehealth: Payer: Self-pay | Admitting: Family Medicine

## 2012-07-09 DIAGNOSIS — M109 Gout, unspecified: Secondary | ICD-10-CM

## 2012-07-09 MED ORDER — COLCHICINE 0.6 MG PO TABS: 0.6000 mg | ORAL_TABLET | Freq: Two times a day (BID) | ORAL | Status: DC | PRN

## 2012-07-09 NOTE — Telephone Encounter (Signed)
[facility] 

## 2012-07-09 NOTE — Telephone Encounter (Signed)
Pt needs a refill on Colchicine .6mg  called into the [facility] [address] In [place] Patient had labs done last time he was here and would like a refill.

## 2014-02-22 ENCOUNTER — Ambulatory Visit (INDEPENDENT_AMBULATORY_CARE_PROVIDER_SITE_OTHER): Payer: BC Managed Care – PPO | Admitting: Family Medicine

## 2014-02-22 ENCOUNTER — Encounter: Payer: 59 | Admitting: Family Medicine

## 2014-02-22 ENCOUNTER — Encounter: Payer: Self-pay | Admitting: Family Medicine

## 2014-02-22 VITALS — BP 126/76 | HR 71 | Temp 97.9°F | Ht 67.5 in | Wt 208.8 lb

## 2014-02-22 DIAGNOSIS — R319 Hematuria, unspecified: Secondary | ICD-10-CM | POA: Insufficient documentation

## 2014-02-22 DIAGNOSIS — Z0000 Encounter for general adult medical examination without abnormal findings: Secondary | ICD-10-CM

## 2014-02-22 DIAGNOSIS — M1A00X1 Idiopathic chronic gout, unspecified site, with tophus (tophi): Secondary | ICD-10-CM

## 2014-02-22 DIAGNOSIS — Z1211 Encounter for screening for malignant neoplasm of colon: Secondary | ICD-10-CM

## 2014-02-22 DIAGNOSIS — M1000 Idiopathic gout, unspecified site: Secondary | ICD-10-CM

## 2014-02-22 DIAGNOSIS — K625 Hemorrhage of anus and rectum: Secondary | ICD-10-CM

## 2014-02-22 LAB — POCT URINALYSIS DIPSTICK
Bilirubin, UA: NEGATIVE
Blood, UA: NEGATIVE
Glucose, UA: NEGATIVE
Ketones, UA: NEGATIVE
Leukocytes, UA: NEGATIVE
Nitrite, UA: NEGATIVE
Protein, UA: NEGATIVE
Spec Grav, UA: 1.025
Urobilinogen, UA: 0.2
pH, UA: 6

## 2014-02-22 NOTE — Progress Notes (Signed)
Pre visit review using our clinic review tool, if applicable. No additional management support is needed unless otherwise documented below in the visit note. 

## 2014-02-22 NOTE — Assessment & Plan Note (Signed)
New- resolved. ?hemorrhoidal bleeding. Referred to GI for colonoscopy (screening).

## 2014-02-22 NOTE — Assessment & Plan Note (Signed)
Reviewed preventive care protocols, scheduled due services, and updated immunizations Discussed nutrition, exercise, diet, and healthy lifestyle.  Refer to GI for colonoscopy.  Orders Placed This Encounter  Procedures  . CBC with Differential  . Comprehensive metabolic panel  . Lipid panel  . PSA  . Ambulatory referral to Gastroenterology  . Urinalysis Dipstick

## 2014-02-22 NOTE — Patient Instructions (Signed)
Good to see you. We will call you with your lab results. You can also stop by to see Rosaria Ferries on your way out or she will call you to set up your GI appointment for a colonoscopy.

## 2014-02-22 NOTE — Assessment & Plan Note (Signed)
No flares without rx.

## 2014-02-22 NOTE — Assessment & Plan Note (Signed)
UA reassuring- neg today.

## 2014-02-22 NOTE — Progress Notes (Signed)
Subjective:   Patient ID: Paul Goodwin, male    DOB: 1960/06/09, 53 y.o.   MRN: [id number]  Paul Goodwin is a pleasant 53 y.o. year old male who presents to clinic today with Annual Exam  on 02/22/2014  HPI: I have not seen him since 2011.  Has been doing ok- drives a truck.  He did have some bright red blood from his rectum (painless) several months ago. None in over a month or so.  He thinks perhaps his urine was a little pink at the time too. No dysuria. Had a cracked tooth and thought maybe it was from swallowing blood. No black stools. No abdominal pain. No nausea or vomiting. Does have h/o hemorrhoids.  Did notice an "abdominal bulge" that went away.  Not taking any prescription rx for gout- has not had any recent flares.  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  Lab Results  Component Value Date   WBC 7.7 12/31/2011   HGB 15.6 12/31/2011   HCT 46.5 12/31/2011   MCV 91.9 12/31/2011   PLT 296.0 12/31/2011   Lab Results  Component Value Date   CHOL 225* 08/31/2009   HDL 41.10 08/31/2009   LDLDIRECT 123.5 08/31/2009   TRIG 350.0* 08/31/2009   CHOLHDL 5 08/31/2009   Lab Results  Component Value Date   CREATININE 1.0 08/31/2009     No current outpatient prescriptions on file prior to visit.   No current facility-administered medications on file prior to visit.    No Known Allergies  Past Medical History  Diagnosis Date  . History of gout   . History of allergic rhinitis   . Sleep apnea     wears CPAP    Past Surgical History  Procedure Laterality Date  . Fracture surgery      leg, feet, (hardware in left leg)  . Hardware removal      left leg    Family History  Problem Relation Age of Onset  . Alcohol abuse Father   . Diabetes Mother     History   Social History  . Marital Status: Married    Spouse Name: N/A    Number of Children: N/A  . Years of Education: N/A   Occupational History  . Not  on file.   Social History Main Topics  . Smoking status: Never Smoker   . Smokeless tobacco: Not on file  . Alcohol Use: Not on file  . Drug Use: Not on file  . Sexual Activity: Not on file   Other Topics Concern  . 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     Review of Systems  Constitutional: Negative for activity change.  HENT: Positive for dental problem. Negative for congestion and drooling.   Eyes: Negative.   Respiratory: Negative.   Cardiovascular: Negative.   Gastrointestinal: Positive for blood in stool and anal bleeding. Negative for nausea, vomiting, abdominal pain, diarrhea, constipation, abdominal distention and rectal pain.  Neurological: Negative.   Hematological: Negative.   Psychiatric/Behavioral: Negative.   All other systems reviewed and are negative.      Objective:    BP 126/76 mmHg  Pulse 71  Temp(Src) 97.9 F (36.6 C) (Oral)  Ht 5' 7.5" (1.715 m)  Wt 208 lb 12 oz (94.688 kg)  BMI 32.19 kg/m2  SpO2 98% Wt Readings from Last 3 Encounters:  02/22/14 208 lb 12 oz (94.688 kg)  12/31/11 203 lb 4 oz (92.194 kg)  02/17/10 208 lb (94.348 kg)     Physical Exam   General:  overweght male in NAD Eyes:  PERRL Ears:  External ear exam shows no significant lesions or deformities.  Otoscopic examination reveals clear canals, tympanic membranes are intact bilaterally without bulging, retraction, inflammation or discharge. Hearing is grossly normal bilaterally. Nose:  External nasal examination shows no deformity or inflammation. Nasal mucosa are pink and moist without lesions or exudates. Mouth:  Oral mucosa and oropharynx without lesions or exudates.  Teeth in good repair. Neck:  no carotid bruit or thyromegaly  no cervical or supraclavicular lymphadenopathy  Lungs:  Normal respiratory effort, chest expands symmetrically. Lungs are clear to auscultation, no crackles or wheezes. Heart:  Normal rate and regular rhythm. S1 and S2 normal without gallop, murmur, click, rub or other extra sounds. Abdomen:  Bowel sounds positive,abdomen soft and non-tender without masses, organomegaly or hernias noted. Pulses:  R and L posterior tibial pulses are full and equal bilaterally  Extremities:  no edema       Assessment & Plan:   Visit for well man health check - Plan: CBC with Differential, Comprehensive metabolic panel, Lipid panel, PSA  CHRONIC GOUTY ARTHROPATHY WITH TOPHUS  Special screening for malignant neoplasms, colon - Plan: Ambulatory referral to Gastroenterology  Hematuria - Plan: Urinalysis Dipstick No Follow-up on file.

## 2014-02-23 ENCOUNTER — Encounter: Payer: Self-pay | Admitting: Internal Medicine

## 2014-02-23 LAB — LIPID PANEL
Cholesterol: 262 mg/dL — ABNORMAL HIGH (ref 0–200)
HDL: 44.4 mg/dL (ref >39.00)
NonHDL: 217.6
Total CHOL/HDL Ratio: 6
Triglycerides: 389 mg/dL — ABNORMAL HIGH (ref 0.0–149.0)
VLDL: 77.8 mg/dL — ABNORMAL HIGH (ref 0.0–40.0)

## 2014-02-23 LAB — CBC WITH DIFFERENTIAL/PLATELET
Basophils Absolute: 0.1 10*3/uL (ref 0.0–0.1)
Basophils Relative: 0.6 % (ref 0.0–3.0)
Eosinophils Absolute: 0.3 10*3/uL (ref 0.0–0.7)
Eosinophils Relative: 3.8 % (ref 0.0–5.0)
HCT: 44.9 % (ref 39.0–52.0)
Hemoglobin: 15.1 g/dL (ref 13.0–17.0)
Lymphocytes Relative: 24.3 % (ref 12.0–46.0)
Lymphs Abs: 1.9 10*3/uL (ref 0.7–4.0)
MCHC: 33.6 g/dL (ref 30.0–36.0)
MCV: 91.2 fl (ref 78.0–100.0)
Monocytes Absolute: 0.4 10*3/uL (ref 0.1–1.0)
Monocytes Relative: 5.3 % (ref 3.0–12.0)
Neutro Abs: 5.3 10*3/uL (ref 1.4–7.7)
Neutrophils Relative %: 66 % (ref 43.0–77.0)
Platelets: 367 10*3/uL (ref 150.0–400.0)
RBC: 4.92 Mil/uL (ref 4.22–5.81)
RDW: 13.3 % (ref 11.5–15.5)
WBC: 8 10*3/uL (ref 4.0–10.5)

## 2014-02-23 LAB — COMPREHENSIVE METABOLIC PANEL
ALT: 29 U/L (ref 0–53)
AST: 25 U/L (ref 0–37)
Albumin: 4 g/dL (ref 3.5–5.2)
Alkaline Phosphatase: 105 U/L (ref 39–117)
BUN: 13 mg/dL (ref 6–23)
CO2: 24 mEq/L (ref 19–32)
Calcium: 9.9 mg/dL (ref 8.4–10.5)
Chloride: 104 mEq/L (ref 96–112)
Creatinine, Ser: 1.1 mg/dL (ref 0.4–1.5)
GFR: 78.93 mL/min (ref >60.00)
Glucose, Bld: 107 mg/dL — ABNORMAL HIGH (ref 70–99)
Potassium: 4.1 mEq/L (ref 3.5–5.1)
Sodium: 140 mEq/L (ref 135–145)
Total Bilirubin: 0.5 mg/dL (ref 0.2–1.2)
Total Protein: 7.6 g/dL (ref 6.0–8.3)

## 2014-02-23 LAB — LDL CHOLESTEROL, DIRECT: Direct LDL: 150.8 mg/dL

## 2014-02-23 LAB — PSA: PSA: 0.65 ng/mL (ref 0.10–4.00)

## 2014-02-25 ENCOUNTER — Encounter: Payer: Self-pay | Admitting: *Deleted

## 2014-04-09 ENCOUNTER — Ambulatory Visit (AMBULATORY_SURGERY_CENTER): Payer: Self-pay | Admitting: *Deleted

## 2014-04-09 VITALS — Ht 68.0 in | Wt 216.2 lb

## 2014-04-09 DIAGNOSIS — Z1211 Encounter for screening for malignant neoplasm of colon: Secondary | ICD-10-CM

## 2014-04-09 MED ORDER — MOVIPREP 100 G PO SOLR: ORAL | Status: DC

## 2014-04-09 NOTE — Progress Notes (Signed)
No egg or soy allergy  No anesthesia or intubation problems per pt  No diet medications taken  Registered in EMMI   

## 2014-05-07 ENCOUNTER — Encounter: Payer: Self-pay | Admitting: Internal Medicine

## 2014-05-07 ENCOUNTER — Ambulatory Visit (AMBULATORY_SURGERY_CENTER): Payer: BLUE CROSS/BLUE SHIELD | Admitting: Internal Medicine

## 2014-05-07 VITALS — BP 119/88 | HR 51 | Temp 98.4°F | Resp 17 | Ht 67.25 in | Wt 208.0 lb

## 2014-05-07 DIAGNOSIS — Z1211 Encounter for screening for malignant neoplasm of colon: Secondary | ICD-10-CM

## 2014-05-07 DIAGNOSIS — D123 Benign neoplasm of transverse colon: Secondary | ICD-10-CM

## 2014-05-07 MED ORDER — SODIUM CHLORIDE 0.9 % IV SOLN: 500.0000 mL | INTRAVENOUS | Status: DC

## 2014-05-07 NOTE — Op Note (Signed)
[facility]  [address]. [place], [address]   COLONOSCOPY PROCEDURE REPORT  PATIENT: Paul Goodwin, Paul Goodwin  MR#: [id number] BIRTHDATE: Dec 17, 1960 , 53  yrs. old GENDER: male ENDOSCOPIST: Jerene Bears, MD REFERRED FK:CLEXN Aron, M.D. PROCEDURE DATE:  05/07/2014 PROCEDURE:   Colonoscopy with snare polypectomy First Screening Colonoscopy - Avg.  risk and is 50 yrs.  old or older Yes.  Prior Negative Screening - Now for repeat screening. N/A  History of Adenoma - Now for follow-up colonoscopy & has been > or = to 3 yrs.  N/A  Polyps Removed Today? Yes. ASA CLASS:   Class II INDICATIONS:average risk for colon cancer and first colonoscopy. MEDICATIONS: Monitored anesthesia care and Propofol 360 mg IV  DESCRIPTION OF PROCEDURE:   After the risks benefits and alternatives of the procedure were thoroughly explained, informed consent was obtained.  The digital rectal exam revealed no rectal mass.   The LB [id number]  endoscope was introduced through the anus and advanced to the cecum, which was identified by both the appendix and ileocecal valve. No adverse events experienced. The quality of the prep was good, using MoviPrep  The instrument was then slowly withdrawn as the colon was fully examined.   COLON FINDINGS: A sessile polyp measuring 5 mm in size was found in the transverse colon.  A polypectomy was performed with a cold snare.  The resection was complete, the polyp tissue was completely retrieved and sent to histology.   There was moderate diverticulosis noted throughout the entire examined colon. Retroflexed views revealed no abnormalities. The time to cecum=5 minutes 00 seconds.  Withdrawal time=9 minutes 51 seconds.  The scope was withdrawn and the procedure completed. COMPLICATIONS: There were no immediate complications.  ENDOSCOPIC IMPRESSION: 1.   Sessile polyp was found in the transverse colon; polypectomy was performed with a cold snare 2.    Moderate diverticulosis was noted throughout the entire examined colon  RECOMMENDATIONS: 1.  Await pathology results 2.  High fiber diet 3.  If the polyp removed today is proven to be an adenomatous (pre-cancerous) polyp, you will need a repeat colonoscopy in 5 years.  Otherwise you should continue to follow colorectal cancer screening guidelines for "routine risk" patients with colonoscopy in 10 years.  You will receive a letter within 1-2 weeks with the results of your biopsy as well as final recommendations.  Please call my office if you have not received a letter after 3 weeks.  eSigned:  Jerene Bears, MD 05/07/2014 8:52 AM   cc: Arnette Norris MD and The Patient

## 2014-05-07 NOTE — Progress Notes (Signed)
Called to room to assist during endoscopic procedure.  Patient ID and intended procedure confirmed with present staff. Received instructions for my participation in the procedure from the performing physician.  

## 2014-05-07 NOTE — Patient Instructions (Signed)

## 2014-05-07 NOTE — Progress Notes (Signed)
Procedure ends, to recovery, report given and VSS. 

## 2014-05-10 ENCOUNTER — Telehealth: Payer: Self-pay | Admitting: *Deleted

## 2014-05-10 NOTE — Telephone Encounter (Signed)
  Follow up Call-  Call back number 05/07/2014  Post procedure Call Back phone  # [phone]  Permission to leave phone message Yes     Patient questions:  Do you have a fever, pain , or abdominal swelling? No. Pain Score  0  Have you tolerated food without any problems? Yes.    Have you been able to return to your normal activities? Yes.    Do you have any questions about your discharge instructions: Diet   No. Medications  No. Follow up visit  No.  Do you have questions or concerns about your Care? No.  Actions: * If pain score is 4 or above: No action needed, pain <4.

## 2014-05-13 ENCOUNTER — Encounter: Payer: Self-pay | Admitting: Internal Medicine

## 2015-07-08 LAB — LIPID PANEL
Cholesterol: 260 mg/dL — AB (ref 0–200)
Triglycerides: 517 mg/dL — AB (ref 40–160)

## 2015-07-08 LAB — BASIC METABOLIC PANEL
Creatinine: 1.1 mg/dL (ref 0.6–1.3)
Glucose: 243 mg/dL

## 2015-07-08 LAB — HEMOGLOBIN A1C: Hemoglobin A1C: 10.1

## 2015-07-08 LAB — TSH: TSH: 1.61 u[IU]/mL (ref 0.41–5.90)

## 2015-07-18 ENCOUNTER — Encounter: Payer: Self-pay | Admitting: Family Medicine

## 2015-07-18 ENCOUNTER — Ambulatory Visit (INDEPENDENT_AMBULATORY_CARE_PROVIDER_SITE_OTHER): Payer: BLUE CROSS/BLUE SHIELD | Admitting: Family Medicine

## 2015-07-18 VITALS — BP 110/72 | HR 64 | Temp 98.1°F | Wt 214.0 lb

## 2015-07-18 DIAGNOSIS — E1169 Type 2 diabetes mellitus with other specified complication: Secondary | ICD-10-CM | POA: Insufficient documentation

## 2015-07-18 DIAGNOSIS — E119 Type 2 diabetes mellitus without complications: Secondary | ICD-10-CM | POA: Insufficient documentation

## 2015-07-18 DIAGNOSIS — E785 Hyperlipidemia, unspecified: Secondary | ICD-10-CM | POA: Diagnosis not present

## 2015-07-18 MED ORDER — METFORMIN HCL 500 MG PO TABS: 500.0000 mg | ORAL_TABLET | Freq: Two times a day (BID) | ORAL | Status: DC

## 2015-07-18 MED ORDER — ONETOUCH ULTRA SYSTEM W/DEVICE KIT: PACK | Status: DC

## 2015-07-18 NOTE — Progress Notes (Signed)
Subjective:   Patient ID: Paul Goodwin, male    DOB: [birth year], 55 y.o.   MRN: [id number]  Paul Goodwin is a pleasant 55 y.o. year old male who presents to clinic today with Labs Only  on 07/18/2015  HPI:  Has screening labs done at a worker's comp office and brings them in today.  New Onset DM-  Lab Results  Component Value Date   HGBA1C 10.1 07/08/2015   He does have a strong FH of DM- mom died at 19 from complications of brittle diabetes. Has already eliminated sugar from his diet since he found out his labs were abnormal last week. Denies increased thirst or urination.  TG elevated as well, unable to calculate LDL  Lab Results  Component Value Date   CHOL 260* 07/08/2015   HDL 44.40 02/22/2014   LDLDIRECT 150.8 02/22/2014   TRIG 517* 07/08/2015   CHOLHDL 6 02/22/2014   Current Outpatient Prescriptions on File Prior to Visit  Medication Sig Dispense Refill  . Coenzyme Q10 (CO Q 10) 100 MG CAPS Take by mouth.    . Ginkgo Biloba (GINKOBA PO) Take by mouth daily.     . Multiple Vitamin (MULTIVITAMINS PO) Take by mouth daily.    . NON FORMULARY daily. Testosterone booster    . NON FORMULARY daily. Gout resolve     No current facility-administered medications on file prior to visit.    No Known Allergies  Past Medical History  Diagnosis Date  . History of gout   . History of allergic rhinitis   . Arthritis     right wrist  . Sleep apnea     pt doesn't wear CPAP any longer    Past Surgical History  Procedure Laterality Date  . Fracture surgery      leg, feet, (hardware in left leg)  . Hardware removal      left leg  . Right shoulder surgery      had to reattach muscle and tendons to shoulder  . Right leg      has rod placed and then removed    Family History  Problem Relation Age of Onset  . Alcohol abuse Father   . Diabetes Mother   . Colon cancer Neg Hx   . Esophageal cancer Neg Hx   . Stomach cancer Neg Hx   . Rectal cancer Neg Hx      Social History   Social History  . Marital Status: Married    Spouse Name: N/A  . Number of Children: N/A  . Years of Education: N/A   Occupational History  . Not on file.   Social History Main Topics  . Smoking status: Never Smoker   . Smokeless tobacco: Never Used  . Alcohol Use: No  . Drug Use: No  . Sexual Activity: Not on file   Other Topics Concern  . 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   Review of Systems  Constitutional: Negative.   HENT: Negative.   Eyes: Negative.   Respiratory: Negative.   Cardiovascular: Negative.   Gastrointestinal: Negative.   Musculoskeletal: Negative.   Skin: Negative.   Neurological: Negative.   Hematological: Negative.   Psychiatric/Behavioral: Negative.   All other systems reviewed and are negative.  Objective:    BP 110/72 mmHg  Pulse 64  Temp(Src) 98.1 F (36.7 C) (Oral)  Wt 214 lb (97.07 kg)  SpO2 92%   Physical Exam  Constitutional: He is oriented to person, place, and time. He appears well-developed and well-nourished. No distress.  HENT:  Head: Normocephalic and atraumatic.  Eyes: Conjunctivae are normal.  Neck: Normal range of motion.  Cardiovascular: Normal rate.   Pulmonary/Chest: Effort normal.  Musculoskeletal: Normal range of motion.  Neurological: He is alert and oriented to person, place, and time. No cranial nerve deficit.  Skin: Skin is warm and dry. He is not diaphoretic.  Psychiatric: He has a normal mood and affect. His behavior is normal. Judgment and thought content normal.  Nursing note and vitals reviewed.         Assessment & Plan:   Diabetes mellitus, new onset ([place])  HLD (hyperlipidemia) No Follow-up on file.

## 2015-07-18 NOTE — Progress Notes (Signed)
Pre visit review using our clinic review tool, if applicable. No additional management support is needed unless otherwise documented below in the visit note. 

## 2015-07-18 NOTE — Assessment & Plan Note (Signed)
New- >25 minutes spent in face to face time with patient, >50% spent in counselling or coordination of care Start Meformin 500 mg twice daily. eRx sent for glucometer as well. Refer for diabetic teaching. Follow up in 3 months for repeat labs, including lipid panel. The patient indicates understanding of these issues and agrees with the plan.

## 2015-07-18 NOTE — Patient Instructions (Signed)
Great to see you. We are starting Metformin 500 mg twice daily with meals.  We are also referring you to a diabetic nutritionist.  Please come see me in 3 months.

## 2015-07-21 ENCOUNTER — Other Ambulatory Visit: Payer: Self-pay | Admitting: *Deleted

## 2015-07-21 MED ORDER — GLUCOSE BLOOD VI STRP: ORAL_STRIP | Status: DC

## 2015-07-21 MED ORDER — ONETOUCH ULTRASOFT LANCETS MISC
Status: DC
Start: 2015-07-21 — End: 2015-07-22

## 2015-07-22 MED ORDER — BAYER CONTOUR MONITOR W/DEVICE KIT: 1.0000 | PACK | Freq: Once | Status: DC

## 2015-07-22 MED ORDER — BLOOD GLUCOSE TEST VI STRP: 1.0000 | ORAL_STRIP | Freq: Every day | Status: DC

## 2015-07-22 MED ORDER — BAYER MICROLET LANCETS MISC: 1.0000 | Freq: Every day | Status: DC

## 2015-07-22 NOTE — Addendum Note (Signed)
Addended by: Pilar Grammes on: 07/22/2015 04:02 PM   Modules accepted: Orders

## 2015-07-22 NOTE — Telephone Encounter (Signed)
Changed everything over to [employer]

## 2015-07-22 NOTE — Telephone Encounter (Signed)
Insurance will not cover [facility] [employer], but they will cover [place].

## 2015-08-08 ENCOUNTER — Encounter: Payer: BLUE CROSS/BLUE SHIELD | Attending: Family Medicine | Admitting: Dietician

## 2015-08-08 ENCOUNTER — Encounter: Payer: Self-pay | Admitting: Dietician

## 2015-08-08 VITALS — BP 110/72 | Ht 68.0 in | Wt 205.9 lb

## 2015-08-08 DIAGNOSIS — E119 Type 2 diabetes mellitus without complications: Secondary | ICD-10-CM | POA: Diagnosis not present

## 2015-08-08 NOTE — Progress Notes (Signed)
Diabetes Self-Management Education  Visit Type: First/Initial  Appt. Start Time: 1030 Appt. End Time: 1130  08/08/2015  Mr. Paul Goodwin, identified by name and date of birth, is a 55 y.o. male with a diagnosis of Diabetes: Type 2.   ASSESSMENT  Blood pressure 110/72, height 5\' 8"  (1.727 m), weight 205 lb 14.4 oz (93.396 kg). Body mass index is 31.31 kg/(m^2).  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/wk   Patient Concerns Nutrition/Meal planning;Medication;Monitoring;Healthy Lifestyle;Problem Solving;Glycemic Control;Weight Control   Preferred Learning Style Hands on   Learning Readiness Ready   Patient Education   Previous Diabetes Education No   Disease state  Definition of diabetes, type 1 and 2, and the diagnosis of diabetes   Nutrition management  Role of diet in the treatment of diabetes and the relationship between the three main macronutrients and blood glucose level;Food label reading, portion sizes and measuring food.;Carbohydrate counting   Physical activity and exercise  Role of exercise on diabetes management, blood pressure control and cardiac health.  pt had recent gout flare up causing pain in feet which limits ability to walk   Medications Reviewed patients medication for diabetes, action, purpose,  timing of dose and side effects.   Monitoring Purpose and frequency of SMBG.;Identified appropriate SMBG and/or A1C goals.;Yearly dilated eye exam   Chronic complications Dental care;Retinopathy and reason for yearly dilated eye exams;Relationship between chronic complications and blood glucose control   Psychosocial adjustment Role of stress on diabetes   Personal strategies to promote health Lifestyle issues that need to be addressed for better diabetes care      Individualized Plan for Diabetes Self-Management Training:   Learning Objective:  Patient will have a greater understanding of diabetes self-management. Patient education plan is to attend individual and/or group sessions per assessed needs and concerns.   Plan:   Patient Instructions   Check blood sugars 2 x day before breakfast and 2 hrs after supper every day and record Exercise:  Work in more steps as able  Avoid sugar sweetened drinks (soda, tea, coffee, sports drinks, juices) Limit sweets and fried foods Make healthy food choices Eat 3 meals day,  1-2 snacks a day (afternoon and bedtime if late going to sleep) Include 3 carbohydrates servings/meal + protein Include 1 carbohydrate serving/snack + protein Space meals 4-6 hours apart Make a dentist appointment Bring blood sugar records to the next appointment/class Get a Sharps container Return for appointment/classes on:  08-29-15   Expected Outcomes:    positive Education material provided: General Meal Planning Guidelines  If problems or questions, patient to contact team via:  [phone]  Future DSME appointment:  08-29-15

## 2015-08-08 NOTE — Patient Instructions (Signed)
  Check blood sugars 2 x day before breakfast and 2 hrs after supper every day and record Exercise:  Work in more steps as able  Avoid sugar sweetened drinks (soda, tea, coffee, sports drinks, juices) Limit sweets and fried foods Make healthy food choices Eat 3 meals day,  1-2 snacks a day (afternoon and bedtime if late going to sleep) Include 3 carbohydrates servings/meal + protein Include 1 carbohydrate serving/snack + protein Space meals 4-6 hours apart Make a dentist appointment Bring blood sugar records to the next appointment/class Get a Sharps container Return for appointment/classes on:  08-29-15

## 2015-08-15 ENCOUNTER — Ambulatory Visit (INDEPENDENT_AMBULATORY_CARE_PROVIDER_SITE_OTHER): Payer: BLUE CROSS/BLUE SHIELD | Admitting: Family Medicine

## 2015-08-15 ENCOUNTER — Encounter: Payer: Self-pay | Admitting: Family Medicine

## 2015-08-15 VITALS — BP 110/80 | HR 62 | Temp 97.9°F | Ht 68.0 in | Wt 201.1 lb

## 2015-08-15 DIAGNOSIS — E785 Hyperlipidemia, unspecified: Secondary | ICD-10-CM

## 2015-08-15 DIAGNOSIS — Z0000 Encounter for general adult medical examination without abnormal findings: Secondary | ICD-10-CM

## 2015-08-15 DIAGNOSIS — E119 Type 2 diabetes mellitus without complications: Secondary | ICD-10-CM | POA: Diagnosis not present

## 2015-08-15 DIAGNOSIS — Z1159 Encounter for screening for other viral diseases: Secondary | ICD-10-CM

## 2015-08-15 LAB — COMPREHENSIVE METABOLIC PANEL
ALBUMIN: 4.7 g/dL (ref 3.5–5.2)
ALT: 23 U/L (ref 0–53)
AST: 21 U/L (ref 0–37)
Alkaline Phosphatase: 100 U/L (ref 39–117)
BUN: 17 mg/dL (ref 6–23)
CHLORIDE: 101 meq/L (ref 96–112)
CO2: 29 mEq/L (ref 19–32)
Calcium: 10 mg/dL (ref 8.4–10.5)
Creatinine, Ser: 1.08 mg/dL (ref 0.40–1.50)
GFR: 75.97 mL/min (ref >60.00)
GLUCOSE: 111 mg/dL — AB (ref 70–99)
POTASSIUM: 5.3 meq/L — AB (ref 3.5–5.1)
SODIUM: 137 meq/L (ref 135–145)
Total Bilirubin: 0.6 mg/dL (ref 0.2–1.2)
Total Protein: 7 g/dL (ref 6.0–8.3)

## 2015-08-15 LAB — LDL CHOLESTEROL, DIRECT: LDL DIRECT: 145 mg/dL

## 2015-08-15 LAB — PSA: PSA: 0.44 ng/mL (ref 0.10–4.00)

## 2015-08-15 NOTE — Assessment & Plan Note (Signed)
Improving blood sugars. Continue current rx. Recheck a1c in June 2017.  Urine micro declines. Declines pneumovax.

## 2015-08-15 NOTE — Assessment & Plan Note (Signed)
Reviewed preventive care protocols, scheduled due services, and updated immunizations Discussed nutrition, exercise, diet, and healthy lifestyle.  

## 2015-08-15 NOTE — Progress Notes (Signed)
Subjective:   Patient ID: Paul Goodwin, male    DOB: 1960/09/27, 55 y.o.   MRN: [id number]  Kerrie Latour is a pleasant 55 y.o. year old male who presents to clinic today with Annual Exam and follow up of chronic medical conditions on 08/15/2015  HPI:  Colonoscopy 05/07/14  Diabetes- diagnosed last month. Started on Metformin 500 mg twice daily and referred to diabetic teaching. Did attend diabetic teaching last week.  Note reviewed from 08/08/15.  Has been checking FSBS twice daily- ranging low 100s fasting.  Denies episodes of hypoglycemia.  No GI upset from Metformin.  Eye exam 4 months ago.   Lab Results  Component Value Date   HGBA1C 10.1 07/08/2015   Lab Results  Component Value Date   CHOL 260* 07/08/2015   HDL 44.40 02/22/2014   LDLDIRECT 150.8 02/22/2014   TRIG 517* 07/08/2015   CHOLHDL 6 02/22/2014   Lab Results  Component Value Date   PSA 0.65 02/22/2014   Current Outpatient Prescriptions on File Prior to Visit  Medication Sig Dispense Refill  . BAYER MICROLET LANCETS lancets 1 each by Other route daily. Use as instructed 100 each 5  . Blood Glucose Monitoring Suppl (BAYER CONTOUR MONITOR) w/Device KIT 1 kit by Does not apply route once. 1 kit 0  . Cinnamon 500 MG capsule Take 500 mg by mouth 2 (two) times daily.    . Ginkgo Biloba (GINKOBA PO) Take by mouth daily.     . Glucose Blood (BLOOD GLUCOSE TEST STRIPS) STRP 1 strip by In Vitro route daily. 100 each 5  . metFORMIN (GLUCOPHAGE) 500 MG tablet Take 1 tablet (500 mg total) by mouth 2 (two) times daily with a meal. 180 tablet 3  . Multiple Vitamin (MULTIVITAMINS PO) Take by mouth daily.    . NON FORMULARY daily. Testosterone booster     No current facility-administered medications on file prior to visit.    No Known Allergies  Past Medical History  Diagnosis Date  . History of gout   . History of allergic rhinitis   . Arthritis     right wrist  . Sleep apnea     pt doesn't wear CPAP any  longer    Past Surgical History  Procedure Laterality Date  . Fracture surgery      leg, feet, (hardware in left leg)  . Hardware removal      left leg  . Right shoulder surgery      had to reattach muscle and tendons to shoulder  . Right leg      has rod placed and then removed    Family History  Problem Relation Age of Onset  . Alcohol abuse Father   . Diabetes Mother   . Colon cancer Neg Hx   . Esophageal cancer Neg Hx   . Stomach cancer Neg Hx   . Rectal cancer Neg Hx     Social History   Social History  . Marital Status: Married    Spouse Name: N/A  . Number of Children: N/A  . Years of Education: N/A   Occupational History  . Not on file.   Social History Main Topics  . Smoking status: Never Smoker   . Smokeless tobacco: Never Used  . Alcohol Use: No  . Drug Use: No  . Sexual Activity: Not on file   Other Topics Concern  . 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  Review of Systems  Constitutional: Negative.   HENT: Negative.   Respiratory: Negative.   Cardiovascular: Negative.   Gastrointestinal: Negative.   Endocrine: Negative.   Genitourinary: Negative.   Musculoskeletal: Negative.   Skin: Negative.   Allergic/Immunologic: Negative.   Neurological: Negative.   Hematological: Negative.   Psychiatric/Behavioral: Negative.   All other systems reviewed and are negative.      Objective:    BP 110/80 mmHg  Pulse 62  Temp(Src) 97.9 F (36.6 C) (Oral)  Ht 5' 8"  (1.727 m)  Wt 201 lb 1.9 oz (91.227 kg)  BMI 30.59 kg/m2  SpO2 98% Wt Readings from Last 3 Encounters:  08/15/15 201 lb 1.9 oz (91.227 kg)  08/08/15 205 lb 14.4 oz (93.396 kg)  07/18/15 214 lb (97.07 kg)     Physical Exam   General:   Pleasant male in NAD Eyes:  PERRL Ears:  External ear exam shows no significant lesions or deformities.  Otoscopic examination reveals clear canals, tympanic membranes are intact bilaterally without bulging, retraction, inflammation or discharge. Hearing is grossly normal bilaterally. Nose:  External nasal examination shows no deformity or inflammation. Nasal mucosa are pink and moist without lesions or exudates. Mouth:  Oral mucosa and oropharynx without lesions or exudates.  Teeth in good repair. Neck:  no carotid bruit or thyromegaly no cervical or supraclavicular lymphadenopathy  Lungs:  Normal respiratory effort, chest expands symmetrically. Lungs are clear to auscultation, no crackles or wheezes. Heart:  Normal rate and regular rhythm. S1 and S2 normal without gallop, murmur, click, rub or other extra sounds. Abdomen:  Bowel sounds positive,abdomen soft and non-tender without masses, organomegaly or hernias noted. Genitalia:  Testes bilaterally descended without nodularity, tenderness or masses. No scrotal masses or lesions. No penis lesions or urethral discharge.. Pulses:  R and L posterior tibial pulses are full and equal bilaterally  Extremities:  no edema       Assessment & Plan:   HLD (hyperlipidemia)  Diabetes mellitus, new onset ([place])  Visit for well man health check No Follow-up on file.

## 2015-08-15 NOTE — Progress Notes (Signed)
Pre visit review using our clinic review tool, if applicable. No additional management support is needed unless otherwise documented below in the visit note. 

## 2015-08-15 NOTE — Patient Instructions (Signed)
Great to see you. We will call you with your results from today and you can view them online.  Please come back to see me in June 2017.  Keep up the great work!

## 2015-08-16 LAB — MICROALBUMIN / CREATININE URINE RATIO
CREATININE, U: 106.2 mg/dL
MICROALB/CREAT RATIO: 0.7 mg/g (ref 0.0–30.0)

## 2015-08-16 LAB — HEPATITIS C ANTIBODY: HCV AB: NEGATIVE

## 2015-08-19 ENCOUNTER — Other Ambulatory Visit: Payer: Self-pay | Admitting: Family Medicine

## 2015-08-19 MED ORDER — COLCHICINE 0.6 MG PO TABS: 0.6000 mg | ORAL_TABLET | Freq: Every day | ORAL | Status: DC

## 2015-08-29 ENCOUNTER — Encounter: Payer: BLUE CROSS/BLUE SHIELD | Attending: Family Medicine | Admitting: Dietician

## 2015-08-29 ENCOUNTER — Encounter: Payer: Self-pay | Admitting: Dietician

## 2015-08-29 VITALS — Ht 68.0 in | Wt 204.5 lb

## 2015-08-29 DIAGNOSIS — E119 Type 2 diabetes mellitus without complications: Secondary | ICD-10-CM | POA: Diagnosis not present

## 2015-08-29 NOTE — Progress Notes (Signed)

## 2015-09-05 ENCOUNTER — Encounter: Payer: BLUE CROSS/BLUE SHIELD | Admitting: Dietician

## 2015-09-05 ENCOUNTER — Encounter: Payer: Self-pay | Admitting: Dietician

## 2015-09-05 VITALS — Ht 68.0 in | Wt 201.0 lb

## 2015-09-05 DIAGNOSIS — E119 Type 2 diabetes mellitus without complications: Secondary | ICD-10-CM | POA: Diagnosis not present

## 2015-09-05 NOTE — Progress Notes (Signed)

## 2015-09-12 ENCOUNTER — Encounter: Payer: BLUE CROSS/BLUE SHIELD | Admitting: Dietician

## 2015-09-12 ENCOUNTER — Encounter: Payer: Self-pay | Admitting: Dietician

## 2015-09-12 VITALS — BP 124/80 | Ht 68.0 in | Wt 196.7 lb

## 2015-09-12 DIAGNOSIS — E119 Type 2 diabetes mellitus without complications: Secondary | ICD-10-CM | POA: Diagnosis not present

## 2015-09-12 NOTE — Progress Notes (Signed)
Appt. Start Time: 5:30   Appt. End Time: 8:30pm  Class 3 Psychosocial - identify DM as a source of stress; state the effects of stress on BG control; verbalize appropriate stress management techniques; identify personal stress issues   Nutritional Management - describe effects of food on blood glucose; identify sources of carbohydrate, protein and fat; verbalize the importance of balance meals in controlling blood glucose; identify meals as well balanced or not; estimate servings of carbohydrate from menus; use food labels to identify servings size, content of carbohydrate, fiber, protein, fat, saturated fat and sodium; recognize food sources of fat, saturated fat, trans fat, sodium and verbalize goals for intake; describe healthful appropriate food choices when dining out   Exercise - describe the effects of exercise on blood glucose and importance of regular exercise in controlling diabetes; state a plan for personal exercise; verbalize contraindications for exercise  Self-Monitoring - state importance of HBGM and demo procedure accurately; use HBGM results to effectively manage diabetes; identify importance of regular HbA1C testing and goals for results  Acute Complications/Sick Day Guidelines - recognize hyperglycemia and hypoglycemia with causes and effects; identify blood glucose results as high, low or in control; list steps in treating and preventing high and low blood glucose; state appropriate measure to manage blood glucose when ill (need for meds, HBGM plan, when to call physician, need for fluids)  Chronic Complications/Foot, Skin, Eye Dental Care - identify possible long-term complications of diabetes (retinopathy, neuropathy, nephropathy, cardiovascular disease, infections); explain steps in prevention and treatment of chronic complications; state importance of daily self-foot exams; describe how to examine feet and what to look for; explain appropriate eye and dental care  Lifestyle  Changes/Goals & Health/Community Resources - state benefits of making appropriate lifestyle changes; identify habits that need to change (meals, tobacco, alcohol); identify strategies to reduce risk factors for personal health; set goals for proper diabetes care; state need for and frequency of healthcare follow-up; describe appropriate community resources for good health (ADA, web sites, apps)   Teaching Materials Used: Class 3 Slide Packet Diabetes Stress Test Stress Management Tools Stress Poem Recipe/Menu Booklet Nutrition Prescription Fast Food Information Goal Setting Worksheet

## 2015-09-13 ENCOUNTER — Encounter: Payer: Self-pay | Admitting: Dietician

## 2015-10-17 ENCOUNTER — Encounter: Payer: Self-pay | Admitting: Family Medicine

## 2015-10-17 ENCOUNTER — Ambulatory Visit (INDEPENDENT_AMBULATORY_CARE_PROVIDER_SITE_OTHER): Payer: Managed Care, Other (non HMO) | Admitting: Family Medicine

## 2015-10-17 VITALS — BP 124/82 | HR 51 | Temp 97.7°F | Wt 193.2 lb

## 2015-10-17 DIAGNOSIS — M1A00X1 Idiopathic chronic gout, unspecified site, with tophus (tophi): Secondary | ICD-10-CM

## 2015-10-17 DIAGNOSIS — E785 Hyperlipidemia, unspecified: Secondary | ICD-10-CM | POA: Diagnosis not present

## 2015-10-17 DIAGNOSIS — E119 Type 2 diabetes mellitus without complications: Secondary | ICD-10-CM

## 2015-10-17 LAB — COMPREHENSIVE METABOLIC PANEL
ALT: 19 U/L (ref 0–53)
AST: 17 U/L (ref 0–37)
Albumin: 4.4 g/dL (ref 3.5–5.2)
Alkaline Phosphatase: 83 U/L (ref 39–117)
BILIRUBIN TOTAL: 0.4 mg/dL (ref 0.2–1.2)
BUN: 13 mg/dL (ref 6–23)
CALCIUM: 9.5 mg/dL (ref 8.4–10.5)
CHLORIDE: 104 meq/L (ref 96–112)
CO2: 29 meq/L (ref 19–32)
CREATININE: 1.04 mg/dL (ref 0.40–1.50)
GFR: 79.3 mL/min (ref >60.00)
GLUCOSE: 113 mg/dL — AB (ref 70–99)
Potassium: 4.6 mEq/L (ref 3.5–5.1)
SODIUM: 139 meq/L (ref 135–145)
Total Protein: 7.1 g/dL (ref 6.0–8.3)

## 2015-10-17 LAB — MICROALBUMIN / CREATININE URINE RATIO
CREATININE, U: 105.8 mg/dL
MICROALB UR: 1.1 mg/dL (ref 0.0–1.9)
Microalb Creat Ratio: 1 mg/g (ref 0.0–30.0)

## 2015-10-17 LAB — LIPID PANEL
CHOL/HDL RATIO: 4
Cholesterol: 197 mg/dL (ref 0–200)
HDL: 44.1 mg/dL (ref >39.00)
LDL Cholesterol: 131 mg/dL — ABNORMAL HIGH (ref 0–99)
NonHDL: 152.48
TRIGLYCERIDES: 105 mg/dL (ref 0.0–149.0)
VLDL: 21 mg/dL (ref 0.0–40.0)

## 2015-10-17 LAB — HEMOGLOBIN A1C: HEMOGLOBIN A1C: 6.4 % (ref 4.6–6.5)

## 2015-10-17 NOTE — Patient Instructions (Signed)
Great to see you. Great work! We will call you with your lab results.

## 2015-10-17 NOTE — Assessment & Plan Note (Signed)
Doing well. Continue current rx. Check labs today. Orders Placed This Encounter  Procedures  . Hemoglobin A1c  . Microalbumin / creatinine urine ratio  . Lipid panel  . Comprehensive metabolic panel

## 2015-10-17 NOTE — Progress Notes (Signed)
Subjective:   Patient ID: Paul Goodwin, male    DOB: 07/11/1960, 55 y.o.   MRN: [id number]  Paul Goodwin is a pleasant 55 y.o. year old male who presents to clinic today with Follow-up  on 10/17/2015  HPI:   Diabetes- diagnosed 06/2015. Started on Metformin 500 mg twice daily and referred to diabetic teaching. Did attend diabetic teaching 4 times, notes reviewed.  Has been checking FSBS twice daily- ranging low 100s fasting.  Denies episodes of hypoglycemia.  No GI upset from Metformin.  Eye exam UTD.  TG elevated as well, unable to calculate LDL  Wt Readings from Last 3 Encounters:  10/17/15 193 lb 4 oz (87.658 kg)  09/12/15 196 lb 11.2 oz (89.223 kg)  09/05/15 201 lb (91.173 kg)     Lab Results  Component Value Date   HGBA1C 10.1 07/08/2015     Lab Results  Component Value Date   CHOL 260* 07/08/2015   HDL 44.40 02/22/2014   LDLDIRECT 145.0 08/15/2015   TRIG 517* 07/08/2015   CHOLHDL 6 02/22/2014   Current Outpatient Prescriptions on File Prior to Visit  Medication Sig Dispense Refill  . BAYER MICROLET LANCETS lancets 1 each by Other route daily. Use as instructed 100 each 5  . Blood Glucose Monitoring Suppl (BAYER CONTOUR MONITOR) w/Device KIT 1 kit by Does not apply route once. 1 kit 0  . Cinnamon 500 MG capsule Take 500 mg by mouth 2 (two) times daily.    . colchicine 0.6 MG tablet Take 1 tablet (0.6 mg total) by mouth daily. 30 tablet 0  . Ginkgo Biloba (GINKOBA PO) Take by mouth daily.     . Glucose Blood (BLOOD GLUCOSE TEST STRIPS) STRP 1 strip by In Vitro route daily. 100 each 5  . metFORMIN (GLUCOPHAGE) 500 MG tablet Take 1 tablet (500 mg total) by mouth 2 (two) times daily with a meal. 180 tablet 3  . Multiple Vitamin (MULTIVITAMINS PO) Take by mouth daily.    . NON FORMULARY daily. Testosterone booster     No current facility-administered medications on file prior to visit.    No Known Allergies  Past Medical History  Diagnosis Date  .  History of gout   . History of allergic rhinitis   . Arthritis     right wrist  . Sleep apnea     pt doesn't wear CPAP any longer    Past Surgical History  Procedure Laterality Date  . Fracture surgery      leg, feet, (hardware in left leg)  . Hardware removal      left leg  . Right shoulder surgery      had to reattach muscle and tendons to shoulder  . Right leg      has rod placed and then removed    Family History  Problem Relation Age of Onset  . Alcohol abuse Father   . Diabetes Mother   . Colon cancer Neg Hx   . Esophageal cancer Neg Hx   . Stomach cancer Neg Hx   . Rectal cancer Neg Hx     Social History   Social History  . Marital Status: Married    Spouse Name: N/A  . Number of Children: N/A  . Years of Education: N/A   Occupational History  . Not on file.   Social History Main Topics  . Smoking status: Never Smoker   . Smokeless tobacco: Never Used  . Alcohol Use: No  . Drug Use: No  .  Sexual Activity: Not on file   Other Topics Concern  . 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   Review of Systems  Constitutional: Negative.   HENT: Negative.   Eyes: Negative.   Respiratory: Negative.   Cardiovascular: Negative.   Gastrointestinal: Negative.   Musculoskeletal: Negative.   Skin: Negative.   Neurological: Negative.   Hematological: Negative.   Psychiatric/Behavioral: Negative.   All other systems reviewed and are negative.      Objective:    BP 124/82 mmHg  Pulse 51  Temp(Src) 97.7 F (36.5 C) (Oral)  Wt 193 lb 4 oz (87.658 kg)  SpO2 98%  BP Readings from Last 3 Encounters:  10/17/15 124/82  09/12/15 124/80  08/15/15 110/80      Physical Exam  Constitutional: He is oriented to  person, place, and time. He appears well-developed and well-nourished. No distress.  HENT:  Head: Normocephalic and atraumatic.  Eyes: Conjunctivae are normal.  Neck: Normal range of motion.  Cardiovascular: Normal rate.   Pulmonary/Chest: Effort normal.  Musculoskeletal: Normal range of motion.  Neurological: He is alert and oriented to person, place, and time. No cranial nerve deficit.  Skin: Skin is warm and dry. He is not diaphoretic.  Psychiatric: He has a normal mood and affect. His behavior is normal. Judgment and thought content normal.  Nursing note and vitals reviewed.         Assessment & Plan:   Diabetes mellitus, new onset ([place]) - Plan: Hemoglobin A1c, Microalbumin / creatinine urine ratio  CHRONIC GOUTY ARTHROPATHY WITH TOPHUS  HLD (hyperlipidemia) - Plan: Lipid panel, Comprehensive metabolic panel No Follow-up on file.

## 2015-10-17 NOTE — Progress Notes (Signed)
Pre visit review using our clinic review tool, if applicable. No additional management support is needed unless otherwise documented below in the visit note. 

## 2015-10-17 NOTE — Assessment & Plan Note (Signed)
- 

## 2015-10-18 ENCOUNTER — Encounter: Payer: Self-pay | Admitting: *Deleted

## 2015-10-24 ENCOUNTER — Other Ambulatory Visit: Payer: Self-pay | Admitting: *Deleted

## 2015-10-24 MED ORDER — GLUCOSE BLOOD VI STRP: ORAL_STRIP | Status: DC

## 2017-01-07 ENCOUNTER — Ambulatory Visit (INDEPENDENT_AMBULATORY_CARE_PROVIDER_SITE_OTHER): Payer: 59 | Admitting: Family Medicine

## 2017-01-07 ENCOUNTER — Encounter: Payer: Self-pay | Admitting: Family Medicine

## 2017-01-07 VITALS — BP 118/80 | HR 68 | Temp 98.0°F | Wt 199.5 lb

## 2017-01-07 DIAGNOSIS — M1A00X1 Idiopathic chronic gout, unspecified site, with tophus (tophi): Secondary | ICD-10-CM | POA: Diagnosis not present

## 2017-01-07 DIAGNOSIS — E119 Type 2 diabetes mellitus without complications: Secondary | ICD-10-CM

## 2017-01-07 LAB — COMPREHENSIVE METABOLIC PANEL
ALK PHOS: 85 U/L (ref 39–117)
ALT: 17 U/L (ref 0–53)
AST: 15 U/L (ref 0–37)
Albumin: 4.4 g/dL (ref 3.5–5.2)
BUN: 18 mg/dL (ref 6–23)
CO2: 29 mEq/L (ref 19–32)
CREATININE: 1.12 mg/dL (ref 0.40–1.50)
Calcium: 10 mg/dL (ref 8.4–10.5)
Chloride: 102 mEq/L (ref 96–112)
GFR: 72.46 mL/min (ref >60.00)
GLUCOSE: 135 mg/dL — AB (ref 70–99)
POTASSIUM: 5 meq/L (ref 3.5–5.1)
SODIUM: 139 meq/L (ref 135–145)
TOTAL PROTEIN: 7.4 g/dL (ref 6.0–8.3)
Total Bilirubin: 0.8 mg/dL (ref 0.2–1.2)

## 2017-01-07 LAB — HEMOGLOBIN A1C: Hgb A1c MFr Bld: 6.7 % — ABNORMAL HIGH (ref 4.6–6.5)

## 2017-01-07 LAB — URIC ACID: Uric Acid, Serum: 9 mg/dL — ABNORMAL HIGH (ref 4.0–7.8)

## 2017-01-07 MED ORDER — FEBUXOSTAT 40 MG PO TABS: 40.0000 mg | ORAL_TABLET | Freq: Every day | ORAL | 3 refills | Status: DC

## 2017-01-07 NOTE — Assessment & Plan Note (Signed)
Due for labs today. 

## 2017-01-07 NOTE — Assessment & Plan Note (Signed)
Discussed tx options- eRx sent for uloric. Check uric acid today.

## 2017-01-07 NOTE — Progress Notes (Signed)
Subjective:   Patient ID: Paul Goodwin, male    DOB: [birth year], 56 y.o.   MRN: [id number]  Paul Goodwin is a pleasant 56 y.o. year old male who presents to clinic today with gout flare-up  on 01/07/2017  HPI:  Has been working a lot- Administrator. Has not been seen for follow up diabetes.  Has had multiple gout flares- most often his right knee.  Cannot take colchicine- causes diarrhea that he cannot tolerate on the road while working.  He just "waits it out."  On the end of a flare now, asking for preventative rx.   Current Outpatient Prescriptions on File Prior to Visit  Medication Sig Dispense Refill  . BAYER MICROLET LANCETS lancets 1 each by Other route daily. Use as instructed 100 each 5  . Blood Glucose Monitoring Suppl (BAYER CONTOUR MONITOR) w/Device KIT 1 kit by Does not apply route once. 1 kit 0  . Cinnamon 500 MG capsule Take 500 mg by mouth 2 (two) times daily.    Marland Kitchen glucose blood (ONE TOUCH TEST STRIPS) test strip Use as instructed to test blood sugar once daily E11.9 100 each 12  . Multiple Vitamin (MULTIVITAMINS PO) Take by mouth daily.    . NON FORMULARY daily. Testosterone booster    . colchicine 0.6 MG tablet Take 1 tablet (0.6 mg total) by mouth daily. (Patient not taking: Reported on 01/07/2017) 30 tablet 0  . metFORMIN (GLUCOPHAGE) 500 MG tablet Take 1 tablet (500 mg total) by mouth 2 (two) times daily with a meal. (Patient not taking: Reported on 01/07/2017) 180 tablet 3   No current facility-administered medications on file prior to visit.     No Known Allergies  Past Medical History:  Diagnosis Date  . Arthritis    right wrist  . History of allergic rhinitis   . History of gout   . Sleep apnea    pt doesn't wear CPAP any longer    Past Surgical History:  Procedure Laterality Date  . FRACTURE SURGERY     leg, feet, (hardware in left leg)  . HARDWARE REMOVAL     left leg  . right leg     has rod placed and then removed  . right  shoulder surgery     had to reattach muscle and tendons to shoulder    Family History  Problem Relation Age of Onset  . Alcohol abuse Father   . Diabetes Mother   . Colon cancer Neg Hx   . Esophageal cancer Neg Hx   . Stomach cancer Neg Hx   . Rectal cancer Neg Hx     Social History   Social History  . Marital status: Married    Spouse name: N/A  . Number of children: N/A  . Years of education: N/A   Occupational History  . Not on file.   Social History Main Topics  . Smoking status: Never Smoker  . Smokeless tobacco: Never Used  . Alcohol use No  . Drug use: No  . Sexual activity: Not on file   Other Topics Concern  . 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   Review of Systems  Constitutional: Negative.   Respiratory: Negative.   Musculoskeletal: Positive for arthralgias.  Neurological: Negative.   Hematological: Negative.   Psychiatric/Behavioral: Negative.   All other systems reviewed and are negative.      Objective:    BP 118/80   Pulse 68   Temp 98 F (36.7 C) (Oral)   Wt 199 lb 8 oz (90.5 kg)   BMI 30.33 kg/m    Physical Exam  Constitutional: He is oriented to person, place, and time. He appears well-developed and well-nourished. No distress.  HENT:  Head: Normocephalic and atraumatic.  Eyes: Conjunctivae are normal.  Cardiovascular: Normal rate.   Pulmonary/Chest: Effort normal.  Musculoskeletal:       Right knee: He exhibits no swelling, no effusion and no erythema.  Neurological: He is alert and oriented to person, place, and time. No cranial nerve deficit.  Skin: Skin is warm and dry. He is not diaphoretic.  Psychiatric: He has a normal mood and affect. His behavior is normal. Judgment and  thought content normal.  Nursing note and vitals reviewed.         Assessment & Plan:   CHRONIC GOUTY ARTHROPATHY WITH TOPHUS No Follow-up on file.

## 2017-01-07 NOTE — Patient Instructions (Signed)
Great to see you.  We will call you with your lab results and you can view them online.  Start Uloric 40 mg daily.

## 2017-01-19 ENCOUNTER — Encounter: Payer: Self-pay | Admitting: Family Medicine

## 2017-01-21 ENCOUNTER — Other Ambulatory Visit: Payer: Self-pay | Admitting: Family Medicine

## 2017-01-21 MED ORDER — ALLOPURINOL 100 MG PO TABS: 100.0000 mg | ORAL_TABLET | Freq: Every day | ORAL | 6 refills | Status: DC

## 2017-02-10 ENCOUNTER — Encounter: Payer: Self-pay | Admitting: Family Medicine

## 2017-02-11 ENCOUNTER — Other Ambulatory Visit: Payer: Self-pay | Admitting: Family Medicine

## 2017-02-11 DIAGNOSIS — M109 Gout, unspecified: Secondary | ICD-10-CM

## 2017-02-11 MED ORDER — COLCHICINE 0.6 MG PO TABS: 0.6000 mg | ORAL_TABLET | Freq: Two times a day (BID) | ORAL | 2 refills | Status: DC | PRN

## 2017-08-19 ENCOUNTER — Ambulatory Visit: Payer: 59 | Admitting: Internal Medicine

## 2017-08-19 ENCOUNTER — Encounter: Payer: Self-pay | Admitting: Internal Medicine

## 2017-08-19 VITALS — BP 136/80 | HR 58 | Temp 98.1°F | Ht 68.0 in | Wt 201.8 lb

## 2017-08-19 DIAGNOSIS — Z13818 Encounter for screening for other digestive system disorders: Secondary | ICD-10-CM

## 2017-08-19 DIAGNOSIS — M109 Gout, unspecified: Secondary | ICD-10-CM | POA: Diagnosis not present

## 2017-08-19 DIAGNOSIS — Z125 Encounter for screening for malignant neoplasm of prostate: Secondary | ICD-10-CM

## 2017-08-19 DIAGNOSIS — Z1329 Encounter for screening for other suspected endocrine disorder: Secondary | ICD-10-CM

## 2017-08-19 DIAGNOSIS — Z0184 Encounter for antibody response examination: Secondary | ICD-10-CM | POA: Diagnosis not present

## 2017-08-19 DIAGNOSIS — E119 Type 2 diabetes mellitus without complications: Secondary | ICD-10-CM | POA: Insufficient documentation

## 2017-08-19 DIAGNOSIS — Z1159 Encounter for screening for other viral diseases: Secondary | ICD-10-CM

## 2017-08-19 DIAGNOSIS — E1165 Type 2 diabetes mellitus with hyperglycemia: Secondary | ICD-10-CM | POA: Diagnosis not present

## 2017-08-19 DIAGNOSIS — E559 Vitamin D deficiency, unspecified: Secondary | ICD-10-CM | POA: Diagnosis not present

## 2017-08-19 MED ORDER — ALLOPURINOL 100 MG PO TABS: 100.0000 mg | ORAL_TABLET | Freq: Every day | ORAL | 1 refills | Status: DC

## 2017-08-19 MED ORDER — METFORMIN HCL 500 MG PO TABS: 500.0000 mg | ORAL_TABLET | Freq: Every day | ORAL | 1 refills | Status: DC

## 2017-08-19 MED ORDER — COLCHICINE 0.6 MG PO TABS: 0.6000 mg | ORAL_TABLET | Freq: Two times a day (BID) | ORAL | 2 refills | Status: DC | PRN

## 2017-08-19 NOTE — Patient Instructions (Addendum)
Please use sunscreen Neutrogena ultrasheer spray for body and lotion for face SPF 30   Schedule fasting labs Thursday or Friday this week no food only water x 12 hours  F/u with me in 3 months sooner if needed   Diabetes Mellitus and Nutrition When you have diabetes (diabetes mellitus), it is very important to have healthy eating habits because your blood sugar (glucose) levels are greatly affected by what you eat and drink. Eating healthy foods in the appropriate amounts, at about the same times every day, can help you:  Control your blood glucose.  Lower your risk of heart disease.  Improve your blood pressure.  Reach or maintain a healthy weight.  Every person with diabetes is different, and each person has different needs for a meal plan. Your health care provider may recommend that you work with a diet and nutrition specialist (dietitian) to make a meal plan that is best for you. Your meal plan may vary depending on factors such as:  The calories you need.  The medicines you take.  Your weight.  Your blood glucose, blood pressure, and cholesterol levels.  Your activity level.  Other health conditions you have, such as heart or kidney disease.  How do carbohydrates affect me? Carbohydrates affect your blood glucose level more than any other type of food. Eating carbohydrates naturally increases the amount of glucose in your blood. Carbohydrate counting is a method for keeping track of how many carbohydrates you eat. Counting carbohydrates is important to keep your blood glucose at a healthy level, especially if you use insulin or take certain oral diabetes medicines. It is important to know how many carbohydrates you can safely have in each meal. This is different for every person. Your dietitian can help you calculate how many carbohydrates you should have at each meal and for snack. Foods that contain carbohydrates include:  Bread, cereal, rice, pasta, and  crackers.  Potatoes and corn.  Peas, beans, and lentils.  Milk and yogurt.  Fruit and juice.  Desserts, such as cakes, cookies, ice cream, and candy.  How does alcohol affect me? Alcohol can cause a sudden decrease in blood glucose (hypoglycemia), especially if you use insulin or take certain oral diabetes medicines. Hypoglycemia can be a life-threatening condition. Symptoms of hypoglycemia (sleepiness, dizziness, and confusion) are similar to symptoms of having too much alcohol. If your health care provider says that alcohol is safe for you, follow these guidelines:  Limit alcohol intake to no more than 1 drink per day for nonpregnant women and 2 drinks per day for men. One drink equals 12 oz of beer, 5 oz of wine, or 1 oz of hard liquor.  Do not drink on an empty stomach.  Keep yourself hydrated with water, diet soda, or unsweetened iced tea.  Keep in mind that regular soda, juice, and other mixers may contain a lot of sugar and must be counted as carbohydrates.  What are tips for following this plan? Reading food labels  Start by checking the serving size on the label. The amount of calories, carbohydrates, fats, and other nutrients listed on the label are based on one serving of the food. Many foods contain more than one serving per package.  Check the total grams (g) of carbohydrates in one serving. You can calculate the number of servings of carbohydrates in one serving by dividing the total carbohydrates by 15. For example, if a food has 30 g of total carbohydrates, it would be equal to 2  servings of carbohydrates.  Check the number of grams (g) of saturated and trans fats in one serving. Choose foods that have low or no amount of these fats.  Check the number of milligrams (mg) of sodium in one serving. Most people should limit total sodium intake to less than 2,300 mg per day.  Always check the nutrition information of foods labeled as "low-fat" or "nonfat". These foods  may be higher in added sugar or refined carbohydrates and should be avoided.  Talk to your dietitian to identify your daily goals for nutrients listed on the label. Shopping  Avoid buying canned, premade, or processed foods. These foods tend to be high in fat, sodium, and added sugar.  Shop around the outside edge of the grocery store. This includes fresh fruits and vegetables, bulk grains, fresh meats, and fresh dairy. Cooking  Use low-heat cooking methods, such as baking, instead of high-heat cooking methods like deep frying.  Cook using healthy oils, such as olive, canola, or sunflower oil.  Avoid cooking with butter, cream, or high-fat meats. Meal planning  Eat meals and snacks regularly, preferably at the same times every day. Avoid going long periods of time without eating.  Eat foods high in fiber, such as fresh fruits, vegetables, beans, and whole grains. Talk to your dietitian about how many servings of carbohydrates you can eat at each meal.  Eat 4-6 ounces of lean protein each day, such as lean meat, chicken, fish, eggs, or tofu. 1 ounce is equal to 1 ounce of meat, chicken, or fish, 1 egg, or 1/4 cup of tofu.  Eat some foods each day that contain healthy fats, such as avocado, nuts, seeds, and fish. Lifestyle   Check your blood glucose regularly.  Exercise at least 30 minutes 5 or more days each week, or as told by your health care provider.  Take medicines as told by your health care provider.  Do not use any products that contain nicotine or tobacco, such as cigarettes and e-cigarettes. If you need help quitting, ask your health care provider.  Work with a Social worker or diabetes educator to identify strategies to manage stress and any emotional and social challenges. What are some questions to ask my health care provider?  Do I need to meet with a diabetes educator?  Do I need to meet with a dietitian?  What number can I call if I have questions?  When are the  best times to check my blood glucose? Where to find more information:  American Diabetes Association: diabetes.org/food-and-fitness/food  Academy of Nutrition and Dietetics: PokerClues.dk  [employer] of Diabetes and Digestive and Kidney Diseases (NIH): ContactWire.be Summary  A healthy meal plan will help you control your blood glucose and maintain a healthy lifestyle.  Working with a diet and nutrition specialist (dietitian) can help you make a meal plan that is best for you.  Keep in mind that carbohydrates and alcohol have immediate effects on your blood glucose levels. It is important to count carbohydrates and to use alcohol carefully. This information is not intended to replace advice given to you by your health care provider. Make sure you discuss any questions you have with your health care provider. Document Released: 01/04/2005 Document Revised: 05/14/2016 Document Reviewed: 05/14/2016 Elsevier Interactive Patient Education  2018 [employer].    Gout Gout is painful swelling that can occur in some of your joints. Gout is a type of arthritis. This condition is caused by having too much uric acid in your  body. Uric acid is a chemical that forms when your body breaks down substances called purines. Purines are important for building body proteins. When your body has too much uric acid, sharp crystals can form and build up inside your joints. This causes pain and swelling. Gout attacks can happen quickly and be very painful (acute gout). Over time, the attacks can affect more joints and become more frequent (chronic gout). Gout can also cause uric acid to build up under your skin and inside your kidneys. What are the causes? This condition is caused by too much uric acid in your blood. This can occur because:  Your kidneys do not remove enough uric acid  from your blood. This is the most common cause.  Your body makes too much uric acid. This can occur with some cancers and cancer treatments. It can also occur if your body is breaking down too many red blood cells (hemolytic anemia).  You eat too many foods that are high in purines. These foods include organ meats and some seafood. Alcohol, especially beer, is also high in purines.  A gout attack may be triggered by trauma or stress. What increases the risk? This condition is more likely to develop in people who:  Have a family history of gout.  Are male and middle-aged.  Are male and have gone through menopause.  Are obese.  Frequently drink alcohol, especially beer.  Are dehydrated.  Lose weight too quickly.  Have an organ transplant.  Have lead poisoning.  Take certain medicines, including aspirin, cyclosporine, diuretics, levodopa, and niacin.  Have kidney disease or psoriasis.  What are the signs or symptoms? An attack of acute gout happens quickly. It usually occurs in just one joint. The most common place is the big toe. Attacks often start at night. Other joints that may be affected include joints of the feet, ankle, knee, fingers, wrist, or elbow. Symptoms may include:  Severe pain.  Warmth.  Swelling.  Stiffness.  Tenderness. The affected joint may be very painful to touch.  Shiny, red, or purple skin.  Chills and fever.  Chronic gout may cause symptoms more frequently. More joints may be involved. You may also have white or yellow lumps (tophi) on your hands or feet or in other areas near your joints. How is this diagnosed? This condition is diagnosed based on your symptoms, medical history, and physical exam. You may have tests, such as:  Blood tests to measure uric acid levels.  Removal of joint fluid with a needle (aspiration) to look for uric acid crystals.  X-rays to look for joint damage.  How is this treated? Treatment for this condition  has two phases: treating an acute attack and preventing future attacks. Acute gout treatment may include medicines to reduce pain and swelling, including:  NSAIDs.  Steroids. These are strong anti-inflammatory medicines that can be taken by mouth (orally) or injected into a joint.  Colchicine. This medicine relieves pain and swelling when it is taken soon after an attack. It can be given orally or through an IV tube.  Preventive treatment may include:  Daily use of smaller doses of NSAIDs or colchicine.  Use of a medicine that reduces uric acid levels in your blood.  Changes to your diet. You may need to see a specialist about healthy eating (dietitian).  Follow these instructions at home: During a Gout Attack  If directed, apply ice to the affected area: ? Put ice in a plastic bag. ? Place a towel  between your skin and the bag. ? Leave the ice on for 20 minutes, 2-3 times a day.  Rest the joint as much as possible. If the affected joint is in your leg, you may be given crutches to use.  Raise (elevate) the affected joint above the level of your heart as often as possible.  Drink enough fluids to keep your urine clear or pale yellow.  Take over-the-counter and prescription medicines only as told by your health care provider.  Do not drive or operate heavy machinery while taking prescription pain medicine.  Follow instructions from your health care provider about eating or drinking restrictions.  Return to your normal activities as told by your health care provider. Ask your health care provider what activities are safe for you. Avoiding Future Gout Attacks  Follow a low-purine diet as told by your dietitian or health care provider. Avoid foods and drinks that are high in purines, including liver, kidney, anchovies, asparagus, herring, mushrooms, mussels, and beer.  Limit alcohol intake to no more than 1 drink a day for nonpregnant women and 2 drinks a day for men. One drink  equals 12 oz of beer, 5 oz of wine, or 1 oz of hard liquor.  Maintain a healthy weight or lose weight if you are overweight. If you want to lose weight, talk with your health care provider. It is important that you do not lose weight too quickly.  Start or maintain an exercise program as told by your health care provider.  Drink enough fluids to keep your urine clear or pale yellow.  Take over-the-counter and prescription medicines only as told by your health care provider.  Keep all follow-up visits as told by your health care provider. This is important. Contact a health care provider if:  You have another gout attack.  You continue to have symptoms of a gout attack after10 days of treatment.  You have side effects from your medicines.  You have chills or a fever.  You have burning pain when you urinate.  You have pain in your lower back or belly. Get help right away if:  You have severe or uncontrolled pain.  You cannot urinate. This information is not intended to replace advice given to you by your health care provider. Make sure you discuss any questions you have with your health care provider. Document Released: 04/06/2000 Document Revised: 09/15/2015 Document Reviewed: 01/20/2015 Elsevier Interactive Patient Education  [employer].

## 2017-08-19 NOTE — Progress Notes (Signed)
Chief Complaint  Patient presents with  . Follow-up   Establish care  1. 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 Gout would like refills of allopurinol and colchcicine prn     Review of Systems  Constitutional: Negative for weight loss.  HENT: Negative for hearing loss.   Eyes: Negative for blurred vision.  Respiratory: Negative for cough.   Cardiovascular: Negative for chest pain.  Gastrointestinal: Negative for abdominal pain.  Musculoskeletal: Negative for falls.  Skin: Negative for rash.  Neurological: Negative for headaches.  Psychiatric/Behavioral: Negative for depression.   Past Medical History:  Diagnosis Date  . Arthritis    right wrist  . History of allergic rhinitis   . History of gout   . Sleep apnea    pt doesn't wear CPAP any longer   Past Surgical History:  Procedure Laterality Date  . FRACTURE SURGERY     leg, feet, (hardware in left leg)  . HARDWARE REMOVAL     left leg  . right leg     has rod placed and then removed  . right shoulder surgery     had to reattach muscle and tendons to shoulder   Family History  Problem Relation Age of Onset  . Alcohol abuse Father   . Diabetes Mother   . Colon cancer Neg Hx   . Esophageal cancer Neg Hx   . Stomach cancer Neg Hx   . Rectal cancer Neg Hx    Social History   Socioeconomic History  . Marital status: Married    Spouse name: Not on file  . Number of children: Not on file  . Years of education: Not on file  . Highest education level: Not on file  Occupational History  . Not on file  Social Needs  . Financial resource strain: Not on file  . Food insecurity:    Worry: Not on file    Inability: Not on file  . Transportation needs:    Medical: Not on file    Non-medical: Not on file  Tobacco Use  . Smoking status: Never Smoker  . Smokeless tobacco: Never Used  Substance and Sexual  Activity  . Alcohol use: No  . Drug use: No  . Sexual activity: Not on file  Lifestyle  . Physical activity:    Days per week: Not on file    Minutes per session: Not on file  . Stress: Not on file  Relationships  . Social connections:    Talks on phone: Not on file    Gets together: Not on file    Attends religious service: Not on file    Active member of club or organization: Not on file    Attends meetings of clubs or organizations: Not on file    Relationship status: Not on file  . Intimate partner violence:    Fear of current or ex partner: Not on file    Emotionally abused: Not on file    Physically abused: Not on file    Forced sexual activity: Not on file  Other Topics Concern  . 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 allopurinol (ZYLOPRIM) 100 MG tablet   . Apple Cider Vinegar 500 MG  TABS   . B Complex-C-Folic Acid (SUPER B COMPLEX/FA/VIT C) TABS   . Cinnamon 500 MG capsule Take 500 mg by mouth 2 (two) times daily.  . Coenzyme Q10 (COQ10 PO) Take by mouth daily.  . colchicine 0.6 MG tablet Take 1 tablet (0.6 mg total) by mouth 2 (two) times daily as needed.  . Garlic 6160 MG CAPS   . glucose blood (ONE TOUCH TEST STRIPS) test strip Use as instructed to test blood sugar once daily E11.9  . Magnesium 250 MG TABS   . metFORMIN (GLUCOPHAGE) 500 MG tablet Take 1 tablet (500 mg total) by mouth 2 (two) times daily with a meal.  . Multiple Vitamin (MULTIVITAMINS PO) Take by mouth daily.  . NON FORMULARY daily. Testosterone booster  . Omega 3 1000 MG CAPS   . Saw Palmetto 450 MG CAPS   . Testosterone 100 MG PLLT    No Known Allergies No results found for this or any previous visit (from the past 2160 hour(s)). Objective  Body mass index is 30.68 kg/m. Wt Readings from Last 3 Encounters:  08/19/17 201 lb 12.8 oz (91.5 kg)   01/07/17 199 lb 8 oz (90.5 kg)  10/17/15 193 lb 4 oz (87.7 kg)   Temp Readings from Last 3 Encounters:  08/19/17 98.1 F (36.7 C) (Oral)  01/07/17 98 F (36.7 C) (Oral)  10/17/15 97.7 F (36.5 C) (Oral)   BP Readings from Last 3 Encounters:  08/19/17 136/80  01/07/17 118/80  10/17/15 124/82   Pulse Readings from Last 3 Encounters:  08/19/17 (!) 58  01/07/17 68  10/17/15 (!) 51    Physical Exam  Constitutional: He is oriented to person, place, and time. Vital signs are normal. He appears well-developed and well-nourished. He is cooperative.  HENT:  Head: Normocephalic and atraumatic.  Mouth/Throat: Oropharynx is clear and moist and mucous membranes are normal.  Eyes: Pupils are equal, round, and reactive to light. Conjunctivae are normal.  Cardiovascular: Normal rate, regular rhythm and normal heart sounds.  Pulmonary/Chest: Effort normal and breath sounds normal.  Abdominal: Soft. Bowel sounds are normal.  Neurological: He is alert and oriented to person, place, and time. Gait normal.  Skin: Skin is warm, dry and intact.  Diffuse lentigos  Psychiatric: He has a normal mood and affect. His speech is normal and behavior is normal. Judgment and thought content normal. Cognition and memory are normal.  Nursing note and vitals reviewed.   Assessment   1. DM 2  2. Gout  3. HM Plan  1. Refilled metformin 500 mg qd  [facility]  Declines flu, pna 23 vaccine  Check labs  2.  Refilled allopurional, colchicine  Check uric acid  3.  Declines vaccine (flu, pna 23)  TD UTD Disc shingrix at f/u   sch fasting labs  Colonoscopy had Dr. Hilarie Fredrickson 05/07/14 tubular adenoma needs f/u in 5 years  Dermatology consider in future  Never smoker wife is exposed 2nd hand  Declines STD check  Provider: Dr. Olivia Mackie McLean-Scocuzza-[facility]

## 2017-08-19 NOTE — Progress Notes (Signed)
Pre visit review using our clinic review tool, if applicable. No additional management support is needed unless otherwise documented below in the visit note. 

## 2017-08-21 ENCOUNTER — Ambulatory Visit: Payer: 59 | Admitting: Internal Medicine

## 2017-08-22 ENCOUNTER — Other Ambulatory Visit (INDEPENDENT_AMBULATORY_CARE_PROVIDER_SITE_OTHER): Payer: 59

## 2017-08-22 DIAGNOSIS — M109 Gout, unspecified: Secondary | ICD-10-CM | POA: Diagnosis not present

## 2017-08-22 DIAGNOSIS — E559 Vitamin D deficiency, unspecified: Secondary | ICD-10-CM | POA: Diagnosis not present

## 2017-08-22 DIAGNOSIS — Z1329 Encounter for screening for other suspected endocrine disorder: Secondary | ICD-10-CM

## 2017-08-22 DIAGNOSIS — Z125 Encounter for screening for malignant neoplasm of prostate: Secondary | ICD-10-CM

## 2017-08-22 DIAGNOSIS — E1165 Type 2 diabetes mellitus with hyperglycemia: Secondary | ICD-10-CM | POA: Diagnosis not present

## 2017-08-22 DIAGNOSIS — Z0184 Encounter for antibody response examination: Secondary | ICD-10-CM

## 2017-08-22 DIAGNOSIS — Z13818 Encounter for screening for other digestive system disorders: Secondary | ICD-10-CM

## 2017-08-22 DIAGNOSIS — Z1159 Encounter for screening for other viral diseases: Secondary | ICD-10-CM

## 2017-08-22 LAB — PSA: PSA: 0.57 ng/mL (ref 0.10–4.00)

## 2017-08-22 LAB — CBC WITH DIFFERENTIAL/PLATELET
BASOS PCT: 1.1 % (ref 0.0–3.0)
Basophils Absolute: 0.1 10*3/uL (ref 0.0–0.1)
EOS ABS: 0.3 10*3/uL (ref 0.0–0.7)
Eosinophils Relative: 5.8 % — ABNORMAL HIGH (ref 0.0–5.0)
HCT: 44.7 % (ref 39.0–52.0)
Hemoglobin: 15.5 g/dL (ref 13.0–17.0)
LYMPHS ABS: 1.2 10*3/uL (ref 0.7–4.0)
Lymphocytes Relative: 24.8 % (ref 12.0–46.0)
MCHC: 34.7 g/dL (ref 30.0–36.0)
MCV: 91.2 fl (ref 78.0–100.0)
Monocytes Absolute: 0.5 10*3/uL (ref 0.1–1.0)
Monocytes Relative: 9.8 % (ref 3.0–12.0)
NEUTROS ABS: 2.8 10*3/uL (ref 1.4–7.7)
NEUTROS PCT: 58.5 % (ref 43.0–77.0)
PLATELETS: 266 10*3/uL (ref 150.0–400.0)
RBC: 4.9 Mil/uL (ref 4.22–5.81)
RDW: 13.5 % (ref 11.5–15.5)
WBC: 4.8 10*3/uL (ref 4.0–10.5)

## 2017-08-22 LAB — LIPID PANEL
CHOL/HDL RATIO: 4
CHOLESTEROL: 212 mg/dL — AB (ref 0–200)
HDL: 50.9 mg/dL (ref >39.00)
LDL CALC: 138 mg/dL — AB (ref 0–99)
NonHDL: 161.39
TRIGLYCERIDES: 117 mg/dL (ref 0.0–149.0)
VLDL: 23.4 mg/dL (ref 0.0–40.0)

## 2017-08-22 LAB — COMPREHENSIVE METABOLIC PANEL
ALT: 20 U/L (ref 0–53)
AST: 22 U/L (ref 0–37)
Albumin: 4.8 g/dL (ref 3.5–5.2)
Alkaline Phosphatase: 94 U/L (ref 39–117)
BUN: 17 mg/dL (ref 6–23)
CALCIUM: 10 mg/dL (ref 8.4–10.5)
CHLORIDE: 105 meq/L (ref 96–112)
CO2: 28 meq/L (ref 19–32)
Creatinine, Ser: 1.23 mg/dL (ref 0.40–1.50)
GFR: 64.89 mL/min (ref >60.00)
GLUCOSE: 121 mg/dL — AB (ref 70–99)
Potassium: 4.9 mEq/L (ref 3.5–5.1)
Sodium: 142 mEq/L (ref 135–145)
Total Bilirubin: 0.8 mg/dL (ref 0.2–1.2)
Total Protein: 7.2 g/dL (ref 6.0–8.3)

## 2017-08-22 LAB — MICROALBUMIN / CREATININE URINE RATIO
Creatinine,U: 147.5 mg/dL
MICROALB/CREAT RATIO: 0.5 mg/g (ref 0.0–30.0)
Microalb, Ur: <0.7 mg/dL (ref 0.0–1.9)

## 2017-08-22 LAB — HEMOGLOBIN A1C: Hgb A1c MFr Bld: 6.2 % (ref 4.6–6.5)

## 2017-08-22 LAB — TSH: TSH: 1 u[IU]/mL (ref 0.35–4.50)

## 2017-08-22 LAB — URIC ACID: URIC ACID, SERUM: 7.2 mg/dL (ref 4.0–7.8)

## 2017-08-22 LAB — T4, FREE: Free T4: 0.69 ng/dL (ref 0.60–1.60)

## 2017-08-22 LAB — VITAMIN D 25 HYDROXY (VIT D DEFICIENCY, FRACTURES): VITD: 38.52 ng/mL (ref 30.00–100.00)

## 2017-08-23 LAB — HEPATITIS B SURFACE ANTIGEN: Hepatitis B Surface Ag: NONREACTIVE

## 2017-08-23 LAB — HEPATITIS B SURFACE ANTIBODY, QUANTITATIVE: Hepatitis B-Post: <5 m[IU]/mL — ABNORMAL LOW (ref >=10)

## 2017-08-23 LAB — URINALYSIS, ROUTINE W REFLEX MICROSCOPIC
BILIRUBIN UA: NEGATIVE
Glucose, UA: NEGATIVE
KETONES UA: NEGATIVE
LEUKOCYTES UA: NEGATIVE
NITRITE UA: NEGATIVE
Protein, UA: NEGATIVE
RBC UA: NEGATIVE
SPEC GRAV UA: 1.019 (ref 1.005–1.030)
UUROB: 0.2 mg/dL (ref 0.2–1.0)
pH, UA: 5 (ref 5.0–7.5)

## 2017-08-28 ENCOUNTER — Other Ambulatory Visit: Payer: Self-pay | Admitting: Internal Medicine

## 2017-08-28 DIAGNOSIS — M1A9XX0 Chronic gout, unspecified, without tophus (tophi): Secondary | ICD-10-CM

## 2017-08-28 MED ORDER — ALLOPURINOL 100 MG PO TABS: 150.0000 mg | ORAL_TABLET | Freq: Every day | ORAL | 1 refills | Status: DC

## 2017-10-06 ENCOUNTER — Encounter: Payer: Self-pay | Admitting: Internal Medicine

## 2017-10-06 DIAGNOSIS — M1A9XX0 Chronic gout, unspecified, without tophus (tophi): Secondary | ICD-10-CM

## 2017-10-07 MED ORDER — ALLOPURINOL 100 MG PO TABS: 150.0000 mg | ORAL_TABLET | Freq: Every day | ORAL | 1 refills | Status: DC

## 2017-11-14 ENCOUNTER — Encounter: Payer: Self-pay | Admitting: Internal Medicine

## 2017-11-18 ENCOUNTER — Encounter: Payer: Self-pay | Admitting: Internal Medicine

## 2017-11-18 ENCOUNTER — Ambulatory Visit: Payer: 59 | Admitting: Internal Medicine

## 2017-11-18 VITALS — BP 130/84 | HR 46 | Temp 97.7°F | Resp 18 | Ht 68.0 in | Wt 201.5 lb

## 2017-11-18 DIAGNOSIS — E1165 Type 2 diabetes mellitus with hyperglycemia: Secondary | ICD-10-CM

## 2017-11-18 DIAGNOSIS — E119 Type 2 diabetes mellitus without complications: Secondary | ICD-10-CM | POA: Diagnosis not present

## 2017-11-18 DIAGNOSIS — R001 Bradycardia, unspecified: Secondary | ICD-10-CM

## 2017-11-18 DIAGNOSIS — B351 Tinea unguium: Secondary | ICD-10-CM

## 2017-11-18 DIAGNOSIS — M1A9XX0 Chronic gout, unspecified, without tophus (tophi): Secondary | ICD-10-CM | POA: Diagnosis not present

## 2017-11-18 DIAGNOSIS — E785 Hyperlipidemia, unspecified: Secondary | ICD-10-CM

## 2017-11-18 MED ORDER — ALLOPURINOL 100 MG PO TABS: 150.0000 mg | ORAL_TABLET | Freq: Every day | ORAL | 3 refills | Status: DC

## 2017-11-18 MED ORDER — CICLOPIROX 8 % EX KIT: PACK | CUTANEOUS | 11 refills | Status: DC

## 2017-11-18 NOTE — Progress Notes (Signed)
Pre-visit discussion using our clinic review tool. No additional management support is needed unless otherwise documented below in the visit note.  

## 2017-11-18 NOTE — Patient Instructions (Addendum)
sch fasting labs 12/24/17-11/window; f/u in 6 months Take care    Cholesterol Cholesterol is a white, waxy, fat-like substance that is needed by the human body in small amounts. The liver makes all the cholesterol we need. Cholesterol is carried from the liver by the blood through the blood vessels. Deposits of cholesterol (plaques) may build up on blood vessel (artery) walls. Plaques make the arteries narrower and stiffer. Cholesterol plaques increase the risk for heart attack and stroke. You cannot feel your cholesterol level even if it is very high. The only way to know that it is high is to have a blood test. Once you know your cholesterol levels, you should keep a record of the test results. Work with your health care provider to keep your levels in the desired range. What do the results mean?  Total cholesterol is a rough measure of all the cholesterol in your blood.  LDL (low-density lipoprotein) is the "bad" cholesterol. This is the type that causes plaque to build up on the artery walls. You want this level to be low.  HDL (high-density lipoprotein) is the "good" cholesterol because it cleans the arteries and carries the LDL away. You want this level to be high.  Triglycerides are fat that the body can either burn for energy or store. High levels are closely linked to heart disease. What are the desired levels of cholesterol?  Total cholesterol below 200.  LDL below 100 for people who are at risk, below 70 for people at very high risk.  HDL above 40 is good. A level of 60 or higher is considered to be protective against heart disease.  Triglycerides below 150. How can I lower my cholesterol? Diet Follow your diet program as told by your health care provider.  Choose fish or white meat chicken and [place], roasted or baked. Limit fatty cuts of red meat, fried foods, and processed meats, such as sausage and lunch meats.  Eat lots of fresh fruits and vegetables.  Choose whole  grains, beans, pasta, potatoes, and cereals.  Choose olive oil, corn oil, or canola oil, and use only small amounts.  Avoid butter, mayonnaise, shortening, or palm kernel oils.  Avoid foods with trans fats.  Drink skim or nonfat milk and eat low-fat or nonfat yogurt and cheeses. Avoid whole milk, cream, ice cream, egg yolks, and full-fat cheeses.  Healthier desserts include angel food cake, ginger snaps, animal crackers, hard candy, popsicles, and low-fat or nonfat frozen yogurt. Avoid pastries, cakes, pies, and cookies.  Exercise  Follow your exercise program as told by your health care provider. A regular program: ? Helps to decrease LDL and raise HDL. ? Helps with weight control.  Do things that increase your activity level, such as gardening, walking, and taking the stairs.  Ask your health care provider about ways that you can be more active in your daily life.  Medicine  Take over-the-counter and prescription medicines only as told by your health care provider. ? Medicine may be prescribed by your health care provider to help lower cholesterol and decrease the risk for heart disease. This is usually done if diet and exercise have failed to bring down cholesterol levels. ? If you have several risk factors, you may need medicine even if your levels are normal.  This information is not intended to replace advice given to you by your health care provider. Make sure you discuss any questions you have with your health care provider. Document Released: 01/02/2001 Document Revised: 11/05/2015  Document Reviewed: 10/08/2015 Elsevier Interactive Patient Education  2018 [place].  Fungal Nail Infection Fungal nail infection is a common fungal infection of the toenails or fingernails. This condition affects toenails more often than fingernails. More than one nail may be infected. The condition can be passed from person to person (is contagious). What are the causes? This condition is  caused by a fungus. Several types of funguses can cause the infection. These funguses are common in moist and warm areas. If your hands or feet come into contact with the fungus, it may get into a crack in your fingernail or toenail and cause the infection. What increases the risk? The following factors may make you more likely to develop this condition:  Being male.  Having diabetes.  Being of older age.  Living with someone who has the fungus.  Walking barefoot in areas where the fungus thrives, such as showers or locker rooms.  Having poor circulation.  Wearing shoes and socks that cause your feet to sweat.  Having athlete's foot.  Having a nail injury or history of a recent nail surgery.  Having psoriasis.  Having a weak body defense system (immune system).  What are the signs or symptoms? Symptoms of this condition include:  A pale spot on the nail.  Thickening of the nail.  A nail that becomes yellow or brown.  A brittle or ragged nail edge.  A crumbling nail.  A nail that has lifted away from the nail bed.  How is this diagnosed? This condition is diagnosed with a physical exam. Your health care provider may take a scraping or clipping from your nail to test for the fungus. How is this treated? Mild infections do not need treatment. If you have significant nail changes, treatment may include:  Oral antifungal medicines. You may need to take the medicine for several weeks or several months, and you may not see the results for a long time. These medicines can cause side effects. Ask your health care provider what problems to watch for.  Antifungal nail polish and nail cream. These may be used along with oral antifungal medicines.  Laser treatment of the nail.  Surgery to remove the nail. This may be needed for the most severe infections.  Treatment takes a long time, and the infection may come back. Follow these instructions at home: Medicines  Take or  apply over-the-counter and prescription medicines only as told by your health care provider.  Ask your health care provider about using over-the-counter mentholated ointment on your nails. Lifestyle   Do not share personal items, such as towels or nail clippers.  Trim your nails often.  Wash and dry your hands and feet every day.  Wear absorbent socks, and change your socks frequently.  Wear shoes that allow air to circulate, such as sandals or canvas tennis shoes. Throw out old shoes.  Wear rubber gloves if you are working with your hands in wet areas.  Do not walk barefoot in shower rooms or locker rooms.  Do not use a nail salon that does not use clean instruments.  Do not use artificial nails. General instructions  Keep all follow-up visits as told by your health care provider. This is important.  Use antifungal foot powder on your feet and in your shoes. Contact a health care provider if: Your infection is not getting better or it is getting worse after several months. This information is not intended to replace advice given to you by your health  care provider. Make sure you discuss any questions you have with your health care provider. Document Released: 04/06/2000 Document Revised: 09/15/2015 Document Reviewed: 10/11/2014 Elsevier Interactive Patient Education  2018 [employer].  Bradycardia, Adult Bradycardia is a slower-than-normal heartbeat. A normal resting heart rate for an adult ranges from 60 to 100 beats per minute. With bradycardia, the resting heart rate is less than 60 beats per minute. Bradycardia can prevent enough oxygen from reaching certain areas of your body when you are active. It can be serious if it keeps enough oxygen from reaching your brain and other parts of your body. Bradycardia is not a problem for everyone. For some healthy adults, a slow resting heart rate is normal. What are the causes? This condition may be caused by:  A problem with the  heart, including: ? A problem with the heart's electrical system, such as a heart block. ? A problem with the heart's natural pacemaker (sinus node). ? Heart disease. ? A heart attack. ? Heart damage. ? A heart infection. ? A heart condition that is present at birth (congenital heart defect).  Certain medicines that treat heart conditions.  Certain conditions, such as hypothyroidism and obstructive sleep apnea.  Problems with the balance of chemicals and other substances, like potassium, in the blood.  What increases the risk? This condition is more likely to develop in adults who:  Are age 76 or older.  Have high blood pressure (hypertension), high cholesterol (hyperlipidemia), or diabetes.  Drink heavily, use tobacco or nicotine products, or use drugs.  Are stressed.  What are the signs or symptoms? Symptoms of this condition include:  Light-headedness.  Feeling faint or fainting.  Fatigue and weakness.  Shortness of breath.  Chest pain (angina).  Drowsiness.  Confusion.  Dizziness.  How is this diagnosed? This condition may be diagnosed based on:  Your symptoms.  Your medical history.  A physical exam.  During the exam, your health care provider will listen to your heartbeat and check your pulse. To confirm the diagnosis, your health care provider may order tests, such as:  Blood tests.  An electrocardiogram (ECG). This test records the heart's electrical activity. The test can show how fast your heart is beating and whether the heartbeat is steady.  A test in which you wear a portable device (event recorder or Holter monitor) to record your heart's electrical activity while you go about your day.  Anexercise test.  How is this treated? Treatment for this condition depends on the cause of the condition and how severe your symptoms are. Treatment may involve:  Treatment of the underlying condition.  Changing your medicines or how much medicine  you take.  Having a small, battery-operated device called a pacemaker implanted under the skin. When bradycardia occurs, this device can be used to increase your heart rate and help your heart to beat in a regular rhythm.  Follow these instructions at home: Lifestyle   Manage any health conditions that contribute to bradycardia as told by your health care provider.  Follow a heart-healthy diet. A nutrition specialist (dietitian) can help to educate you about healthy food options and changes.  Follow an exercise program that is approved by your health care provider.  Maintain a healthy weight.  Try to reduce or manage your stress, such as with yoga or meditation. If you need help reducing stress, ask your health care provider.  Do not use use any products that contain nicotine or tobacco, such as cigarettes and e-cigarettes. If  you need help quitting, ask your health care provider.  Do not use illegal drugs.  Limit alcohol intake to no more than 1 drink per day for nonpregnant women and 2 drinks per day for men. One drink equals 12 oz of beer, 5 oz of wine, or 1 oz of hard liquor. General instructions  Take over-the-counter and prescription medicines only as told by your health care provider.  Keep all follow-up visits as directed by your health care provider. This is important. How is this prevented? In some cases, bradycardia may be prevented by:  Treating underlying medical problems.  Stopping behaviors or medicines that can trigger the condition.  Contact a health care provider if:  You feel light-headed or dizzy.  You almost faint.  You feel weak or are easily fatigued during physical activity.  You experience confusion or have memory problems. Get help right away if:  You faint.  You have an irregular heartbeat (palpitations).  You have chest pain.  You have trouble breathing. This information is not intended to replace advice given to you by your health  care provider. Make sure you discuss any questions you have with your health care provider. Document Released: 12/30/2001 Document Revised: 12/06/2015 Document Reviewed: 09/29/2015 Elsevier Interactive Patient Education  2017 [employer].

## 2017-11-18 NOTE — Progress Notes (Signed)
Chief Complaint  Patient presents with  . Follow-up  . Diabetes   F/u  1. DM 2 a1C 6.2 08/22/17 on metformin 500 mg qd just had eye exam with new glasses but he did not make then aware of h/o DM 2  2. Toenail fungus b/l feet right 1st-4th toes and left first and 4th toes nothing tried does not want to try oral medications  3. 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 Gout improved since increase allopurinol to 150 mg qd needs more quantity  5. Bradycardia w/o sx's since childhood he still does not have sx's today no syncope, presyncope, dizziness, CP, fatigue    Review of Systems  Constitutional: Negative for weight loss.  HENT: Negative for hearing loss.   Eyes: Negative for blurred vision.  Respiratory: Negative for shortness of breath.   Cardiovascular: Negative for chest pain and palpitations.  Gastrointestinal: Negative for abdominal pain.  Musculoskeletal: Negative for falls.  Skin:       Toenail fungus  Neurological: Negative for dizziness.  Psychiatric/Behavioral: Negative for depression and memory loss.   Past Medical History:  Diagnosis Date  . Arthritis    right wrist  . History of allergic rhinitis   . History of gout   . Sleep apnea    pt doesn't wear CPAP any longer   Past Surgical History:  Procedure Laterality Date  . FRACTURE SURGERY     leg, feet, (hardware in left leg)  . HARDWARE REMOVAL     left leg  . right leg     has rod placed and then removed  . right shoulder surgery     had to reattach muscle and tendons to shoulder   Family History  Problem Relation Age of Onset  . Alcohol abuse Father   . Diabetes Mother   . Colon cancer Neg Hx   . Esophageal cancer Neg Hx   . Stomach cancer Neg Hx   . Rectal cancer Neg Hx    Social History   Socioeconomic History  . Marital status: Married    Spouse name: Not on file  . Number of children: Not on file  . Years of education: Not on file  . Highest education level:  Not on file  Occupational History  . Not on file  Social Needs  . Financial resource strain: Not on file  . Food insecurity:    Worry: Not on file    Inability: Not on file  . Transportation needs:    Medical: Not on file    Non-medical: Not on file  Tobacco Use  . Smoking status: Never Smoker  . Smokeless tobacco: Never Used  Substance and Sexual Activity  . Alcohol use: No  . Drug use: No  . Sexual activity: Not on file  Lifestyle  . Physical activity:    Days per week: Not on file    Minutes per session: Not on file  . Stress: Not on file  Relationships  . Social connections:    Talks on phone: Not on file    Gets together: Not on file    Attends religious service: Not on file    Active member of club or organization: Not on file    Attends meetings of clubs or organizations: Not on file    Relationship status: Not on file  . Intimate partner violence:    Fear of current or ex partner: Not on file    Emotionally  abused: Not on file    Physically abused: Not on file    Forced sexual activity: Not on file  Other Topics Concern  . 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 allopurinol (ZYLOPRIM) 100 MG tablet Take 1.5 tablets (150 mg total) by mouth daily.  Marland Kitchen Apple Cider Vinegar 500 MG TABS   . B Complex-C-Folic Acid (SUPER B COMPLEX/FA/VIT C) TABS   . BAYER MICROLET LANCETS lancets 1 each by Other route daily. Use as instructed  . Cinnamon 500 MG capsule Take 1,000 mg by mouth 2 (two) times daily.   . Coenzyme Q10 (COQ10 PO) Take by mouth daily.  . colchicine 0.6 MG tablet Take 1 tablet (0.6 mg total) by mouth 2 (two) times daily as needed.  . Garlic 8657 MG CAPS   . glucose blood (ONE TOUCH TEST STRIPS) test strip Use as instructed to test blood sugar once daily E11.9  . Magnesium 250 MG TABS   . metFORMIN (GLUCOPHAGE) 500 MG  tablet Take 1 tablet (500 mg total) by mouth daily with breakfast.  . Multiple Vitamin (MULTIVITAMINS PO) Take by mouth daily.  . NON FORMULARY daily. Testosterone booster  . Omega 3 1000 MG CAPS   . Saw Palmetto 450 MG CAPS   . Testosterone 100 MG PLLT    No Known Allergies Recent Results (from the past 2160 hour(s))  Hepatitis B surface antigen     Status: None   Collection Time: 08/22/17  8:01 AM  Result Value Ref Range   Hepatitis B Surface Ag NON-REACTIVE NON-REACTI  Hepatitis B surface antibody     Status: Abnormal   Collection Time: 08/22/17  8:01 AM  Result Value Ref Range   Hepatitis B-Post <5 (L) > OR = 10 mIU/mL    Comment: . Patient does not have immunity to hepatitis B virus. . For additional information, please refer to http://education.questdiagnostics.com/faq/FAQ105 (This link is being provided for informational/ educational purposes only).   PSA     Status: None   Collection Time: 08/22/17  8:01 AM  Result Value Ref Range   PSA 0.57 0.10 - 4.00 ng/mL    Comment: Test performed using Access Hybritech PSA Assay, a parmagnetic partical, chemiluminecent immunoassay.  Urine Microalbumin w/creat. ratio     Status: None   Collection Time: 08/22/17  8:01 AM  Result Value Ref Range   Microalb, Ur <0.7 0.0 - 1.9 mg/dL   Creatinine,U 147.5 mg/dL   Microalb Creat Ratio 0.5 0.0 - 30.0 mg/g  Urinalysis, Routine w reflex microscopic     Status: None   Collection Time: 08/22/17  8:01 AM  Result Value Ref Range   Specific Gravity, UA 1.019 1.005 - 1.030   pH, UA 5.0 5.0 - 7.5   Color, UA Yellow Yellow   Appearance Ur Clear Clear   Leukocytes, UA Negative Negative   Protein, UA Negative Negative/Trace   Glucose, UA Negative Negative   Ketones, UA Negative Negative   RBC, UA Negative Negative   Bilirubin, UA Negative Negative   Urobilinogen, Ur 0.2 0.2 - 1.0 mg/dL   Nitrite, UA Negative Negative   Microscopic Examination Comment     Comment: Microscopic not indicated  and not performed.  TSH     Status: None   Collection Time: 08/22/17  8:01 AM  Result Value Ref Range   TSH  1.00 0.35 - 4.50 uIU/mL  T4, free     Status: None   Collection Time: 08/22/17  8:01 AM  Result Value Ref Range   Free T4 0.69 0.60 - 1.60 ng/dL    Comment: Specimens from patients who are undergoing biotin therapy and /or ingesting biotin supplements may contain high levels of biotin.  The higher biotin concentration in these specimens interferes with this Free T4 assay.  Specimens that contain high levels  of biotin may cause false high results for this Free T4 assay.  Please interpret results in light of the total clinical presentation of the patient.    Uric acid     Status: None   Collection Time: 08/22/17  8:01 AM  Result Value Ref Range   Uric Acid, Serum 7.2 4.0 - 7.8 mg/dL  Vitamin D (25 hydroxy)     Status: None   Collection Time: 08/22/17  8:01 AM  Result Value Ref Range   VITD 38.52 30.00 - 100.00 ng/mL  Hemoglobin A1c     Status: None   Collection Time: 08/22/17  8:01 AM  Result Value Ref Range   Hgb A1c MFr Bld 6.2 4.6 - 6.5 %    Comment: Glycemic Control Guidelines for People with Diabetes:Non Diabetic:  <6%Goal of Therapy: <7%Additional Action Suggested:  >8%   Lipid panel     Status: Abnormal   Collection Time: 08/22/17  8:01 AM  Result Value Ref Range   Cholesterol 212 (H) 0 - 200 mg/dL    Comment: ATP III Classification       Desirable:  < 200 mg/dL               Borderline High:  200 - 239 mg/dL          High:  > = 240 mg/dL   Triglycerides 117.0 0.0 - 149.0 mg/dL    Comment: Normal:  <150 mg/dLBorderline High:  150 - 199 mg/dL   HDL 50.90 >39.00 mg/dL   VLDL 23.4 0.0 - 40.0 mg/dL   LDL Cholesterol 138 (H) 0 - 99 mg/dL   Total CHOL/HDL Ratio 4     Comment:                Men          Women1/2 Average Risk     3.4          3.3Average Risk          5.0          4.42X Average Risk          9.6          7.13X Average Risk          15.0          11.0                        NonHDL 161.39     Comment: NOTE:  Non-HDL goal should be 30 mg/dL higher than patient's LDL goal (i.e. LDL goal of < 70 mg/dL, would have non-HDL goal of < 100 mg/dL)  CBC with Differential/Platelet     Status: Abnormal   Collection Time: 08/22/17  8:01 AM  Result Value Ref Range   WBC 4.8 4.0 - 10.5 K/uL   RBC 4.90 4.22 - 5.81 Mil/uL   Hemoglobin 15.5 13.0 - 17.0 g/dL   HCT 44.7 39.0 - 52.0 %   MCV 91.2 78.0 - 100.0 fl  MCHC 34.7 30.0 - 36.0 g/dL   RDW 13.5 11.5 - 15.5 %   Platelets 266.0 150.0 - 400.0 K/uL   Neutrophils Relative % 58.5 43.0 - 77.0 %   Lymphocytes Relative 24.8 12.0 - 46.0 %   Monocytes Relative 9.8 3.0 - 12.0 %   Eosinophils Relative 5.8 (H) 0.0 - 5.0 %   Basophils Relative 1.1 0.0 - 3.0 %   Neutro Abs 2.8 1.4 - 7.7 K/uL   Lymphs Abs 1.2 0.7 - 4.0 K/uL   Monocytes Absolute 0.5 0.1 - 1.0 K/uL   Eosinophils Absolute 0.3 0.0 - 0.7 K/uL   Basophils Absolute 0.1 0.0 - 0.1 K/uL  Comprehensive metabolic panel     Status: Abnormal   Collection Time: 08/22/17  8:01 AM  Result Value Ref Range   Sodium 142 135 - 145 mEq/L   Potassium 4.9 3.5 - 5.1 mEq/L   Chloride 105 96 - 112 mEq/L   CO2 28 19 - 32 mEq/L   Glucose, Bld 121 (H) 70 - 99 mg/dL   BUN 17 6 - 23 mg/dL   Creatinine, Ser 1.23 0.40 - 1.50 mg/dL   Total Bilirubin 0.8 0.2 - 1.2 mg/dL   Alkaline Phosphatase 94 39 - 117 U/L   AST 22 0 - 37 U/L   ALT 20 0 - 53 U/L   Total Protein 7.2 6.0 - 8.3 g/dL   Albumin 4.8 3.5 - 5.2 g/dL   Calcium 10.0 8.4 - 10.5 mg/dL   GFR 64.89 >60.00 mL/min   Objective  There is no height or weight on file to calculate BMI. Wt Readings from Last 3 Encounters:  08/19/17 201 lb 12.8 oz (91.5 kg)  01/07/17 199 lb 8 oz (90.5 kg)  10/17/15 193 lb 4 oz (87.7 kg)   Temp Readings from Last 3 Encounters:  08/19/17 98.1 F (36.7 C) (Oral)  01/07/17 98 F (36.7 C) (Oral)  10/17/15 97.7 F (36.5 C) (Oral)   BP Readings from Last 3 Encounters:  08/19/17 136/80   01/07/17 118/80  10/17/15 124/82   Pulse Readings from Last 3 Encounters:  08/19/17 (!) 58  01/07/17 68  10/17/15 (!) 51    Physical Exam  Constitutional: He is oriented to person, place, and time. Vital signs are normal. He appears well-developed and well-nourished. He is cooperative.  HENT:  Head: Normocephalic and atraumatic.  Mouth/Throat: Oropharynx is clear and moist and mucous membranes are normal.  Eyes: Pupils are equal, round, and reactive to light. Conjunctivae are normal.  Cardiovascular: Normal rate, regular rhythm and normal heart sounds.  Pulmonary/Chest: Effort normal and breath sounds normal.  Genitourinary: Rectum normal and prostate normal. Rectal exam shows no external hemorrhoid. Prostate is not enlarged and not tender.  Neurological: He is alert and oriented to person, place, and time. Gait normal.  Skin: Skin is warm and dry.  Onychomycosis right 1st to 4th toes and left big and 4th toes  Psychiatric: He has a normal mood and affect. His speech is normal and behavior is normal. Judgment and thought content normal. Cognition and memory are normal.  Nursing note and vitals reviewed.   Assessment   1. DM2 controlled A1C 6.2 last checked 08/2017  2.HLD uncontrolled  3. Onychomycosis right 1st-4th toes and left 1st and 4th toes  4. asx'matic bradycardia  5. Gout  6. HM  Plan   1.  Cont metformin 500 mg qd  Had eye exam 2018/2019 did not let them know h/o DM advised do at  yearly f/u  Declines statin  Check foot exam at f/u with monofilament Check BMET, lipid A1C in future 2-4 months 2. Given cholesterol list  Declines statin 3. Topical tx declines oral  4. Declines cardiology referral for now  5. 150 mg allopurinol helping  Check uric acid in 2-4 months  6.  Declines vaccine (flu, pna 23, shingles, hep B vaccines)  TD UTD due 2021 this is only vaccine agreeable to  sch fasting labs  Colonoscopy had Dr. Hilarie Fredrickson 05/07/14 tubular adenoma needs f/u in 5  years  Dermatology consider in future Aks to changes mild and early rec sunscreen  Never smoker wife is exposed 2nd hand  Declines STD check  PSA normal 0.57 08/22/17 DRE today normal   Of note h/o OSA not using cpap   Provider: Dr. Olivia Mackie McLean-Scocuzza-[facility]

## 2017-12-20 ENCOUNTER — Other Ambulatory Visit: Payer: 59

## 2017-12-24 ENCOUNTER — Other Ambulatory Visit: Payer: 59

## 2018-01-06 ENCOUNTER — Other Ambulatory Visit (INDEPENDENT_AMBULATORY_CARE_PROVIDER_SITE_OTHER): Payer: 59

## 2018-01-06 DIAGNOSIS — M1A9XX0 Chronic gout, unspecified, without tophus (tophi): Secondary | ICD-10-CM | POA: Diagnosis not present

## 2018-01-06 DIAGNOSIS — E119 Type 2 diabetes mellitus without complications: Secondary | ICD-10-CM

## 2018-01-06 LAB — LIPID PANEL
CHOLESTEROL: 217 mg/dL — AB (ref 0–200)
HDL: 46.7 mg/dL (ref >39.00)
LDL CALC: 136 mg/dL — AB (ref 0–99)
NonHDL: 170.34
TRIGLYCERIDES: 174 mg/dL — AB (ref 0.0–149.0)
Total CHOL/HDL Ratio: 5
VLDL: 34.8 mg/dL (ref 0.0–40.0)

## 2018-01-06 LAB — BASIC METABOLIC PANEL
BUN: 21 mg/dL (ref 6–23)
CALCIUM: 9.9 mg/dL (ref 8.4–10.5)
CO2: 28 meq/L (ref 19–32)
Chloride: 102 mEq/L (ref 96–112)
Creatinine, Ser: 1.05 mg/dL (ref 0.40–1.50)
GFR: 77.78 mL/min (ref >60.00)
GLUCOSE: 124 mg/dL — AB (ref 70–99)
Potassium: 4.6 mEq/L (ref 3.5–5.1)
SODIUM: 140 meq/L (ref 135–145)

## 2018-01-06 LAB — HEMOGLOBIN A1C: HEMOGLOBIN A1C: 6.2 % (ref 4.6–6.5)

## 2018-01-06 LAB — URIC ACID: Uric Acid, Serum: 6.2 mg/dL (ref 4.0–7.8)

## 2018-01-07 ENCOUNTER — Encounter: Payer: Self-pay | Admitting: Internal Medicine

## 2018-04-14 ENCOUNTER — Ambulatory Visit: Payer: 59 | Admitting: Internal Medicine

## 2018-04-20 ENCOUNTER — Encounter: Payer: Self-pay | Admitting: Internal Medicine

## 2018-04-22 ENCOUNTER — Ambulatory Visit: Payer: 59 | Admitting: Internal Medicine

## 2018-07-22 ENCOUNTER — Encounter: Payer: Self-pay | Admitting: Internal Medicine

## 2018-07-22 ENCOUNTER — Other Ambulatory Visit: Payer: Self-pay | Admitting: Internal Medicine

## 2018-07-22 DIAGNOSIS — Z1329 Encounter for screening for other suspected endocrine disorder: Secondary | ICD-10-CM

## 2018-07-22 DIAGNOSIS — E119 Type 2 diabetes mellitus without complications: Secondary | ICD-10-CM

## 2018-07-22 DIAGNOSIS — M1A9XX0 Chronic gout, unspecified, without tophus (tophi): Secondary | ICD-10-CM

## 2018-07-22 DIAGNOSIS — E785 Hyperlipidemia, unspecified: Secondary | ICD-10-CM

## 2018-07-22 DIAGNOSIS — R001 Bradycardia, unspecified: Secondary | ICD-10-CM

## 2018-07-22 DIAGNOSIS — Z0000 Encounter for general adult medical examination without abnormal findings: Secondary | ICD-10-CM

## 2018-07-22 DIAGNOSIS — Z125 Encounter for screening for malignant neoplasm of prostate: Secondary | ICD-10-CM

## 2018-07-22 DIAGNOSIS — Z1389 Encounter for screening for other disorder: Secondary | ICD-10-CM

## 2018-07-22 DIAGNOSIS — M109 Gout, unspecified: Secondary | ICD-10-CM

## 2018-07-22 MED ORDER — ALLOPURINOL 100 MG PO TABS: 150.0000 mg | ORAL_TABLET | Freq: Every day | ORAL | 3 refills | Status: DC

## 2018-07-22 MED ORDER — METFORMIN HCL 500 MG PO TABS: 500.0000 mg | ORAL_TABLET | Freq: Every day | ORAL | 1 refills | Status: DC

## 2018-08-21 ENCOUNTER — Encounter: Payer: Self-pay | Admitting: Internal Medicine

## 2019-01-20 ENCOUNTER — Other Ambulatory Visit: Payer: Self-pay | Admitting: Internal Medicine

## 2019-01-20 MED ORDER — METFORMIN HCL 500 MG PO TABS: 500.0000 mg | ORAL_TABLET | Freq: Every day | ORAL | 1 refills | Status: DC

## 2019-02-09 DIAGNOSIS — Z0489 Encounter for examination and observation for other specified reasons: Secondary | ICD-10-CM | POA: Diagnosis not present

## 2019-02-16 ENCOUNTER — Other Ambulatory Visit (INDEPENDENT_AMBULATORY_CARE_PROVIDER_SITE_OTHER): Payer: BC Managed Care – PPO

## 2019-02-16 ENCOUNTER — Other Ambulatory Visit: Payer: Self-pay

## 2019-02-16 DIAGNOSIS — E785 Hyperlipidemia, unspecified: Secondary | ICD-10-CM

## 2019-02-16 DIAGNOSIS — Z1389 Encounter for screening for other disorder: Secondary | ICD-10-CM | POA: Diagnosis not present

## 2019-02-16 DIAGNOSIS — Z125 Encounter for screening for malignant neoplasm of prostate: Secondary | ICD-10-CM

## 2019-02-16 DIAGNOSIS — R001 Bradycardia, unspecified: Secondary | ICD-10-CM | POA: Diagnosis not present

## 2019-02-16 DIAGNOSIS — E119 Type 2 diabetes mellitus without complications: Secondary | ICD-10-CM

## 2019-02-16 DIAGNOSIS — Z0000 Encounter for general adult medical examination without abnormal findings: Secondary | ICD-10-CM

## 2019-02-16 DIAGNOSIS — Z1329 Encounter for screening for other suspected endocrine disorder: Secondary | ICD-10-CM | POA: Diagnosis not present

## 2019-02-16 DIAGNOSIS — M109 Gout, unspecified: Secondary | ICD-10-CM

## 2019-02-16 LAB — CBC WITH DIFFERENTIAL/PLATELET
Basophils Absolute: 0.1 10*3/uL (ref 0.0–0.1)
Basophils Relative: 0.8 % (ref 0.0–3.0)
Eosinophils Absolute: 0.2 10*3/uL (ref 0.0–0.7)
Eosinophils Relative: 3.6 % (ref 0.0–5.0)
HCT: 42.8 % (ref 39.0–52.0)
Hemoglobin: 14.4 g/dL (ref 13.0–17.0)
Lymphocytes Relative: 19.4 % (ref 12.0–46.0)
Lymphs Abs: 1.2 10*3/uL (ref 0.7–4.0)
MCHC: 33.6 g/dL (ref 30.0–36.0)
MCV: 94.2 fl (ref 78.0–100.0)
Monocytes Absolute: 0.4 10*3/uL (ref 0.1–1.0)
Monocytes Relative: 6.3 % (ref 3.0–12.0)
Neutro Abs: 4.4 10*3/uL (ref 1.4–7.7)
Neutrophils Relative %: 69.9 % (ref 43.0–77.0)
Platelets: 265 10*3/uL (ref 150.0–400.0)
RBC: 4.54 Mil/uL (ref 4.22–5.81)
RDW: 13.1 % (ref 11.5–15.5)
WBC: 6.3 10*3/uL (ref 4.0–10.5)

## 2019-02-16 LAB — LIPID PANEL
Cholesterol: 211 mg/dL — ABNORMAL HIGH (ref 0–200)
HDL: 43 mg/dL (ref >39.00)
NonHDL: 167.97
Total CHOL/HDL Ratio: 5
Triglycerides: 207 mg/dL — ABNORMAL HIGH (ref 0.0–149.0)
VLDL: 41.4 mg/dL — ABNORMAL HIGH (ref 0.0–40.0)

## 2019-02-16 LAB — HEMOGLOBIN A1C: Hgb A1c MFr Bld: 6.3 % (ref 4.6–6.5)

## 2019-02-16 LAB — LDL CHOLESTEROL, DIRECT: Direct LDL: 133 mg/dL

## 2019-02-16 LAB — COMPREHENSIVE METABOLIC PANEL
ALT: 19 U/L (ref 0–53)
AST: 19 U/L (ref 0–37)
Albumin: 4.7 g/dL (ref 3.5–5.2)
Alkaline Phosphatase: 91 U/L (ref 39–117)
BUN: 13 mg/dL (ref 6–23)
CO2: 28 mEq/L (ref 19–32)
Calcium: 9.5 mg/dL (ref 8.4–10.5)
Chloride: 104 mEq/L (ref 96–112)
Creatinine, Ser: 0.96 mg/dL (ref 0.40–1.50)
GFR: 80.83 mL/min (ref >60.00)
Glucose, Bld: 116 mg/dL — ABNORMAL HIGH (ref 70–99)
Potassium: 4.9 mEq/L (ref 3.5–5.1)
Sodium: 141 mEq/L (ref 135–145)
Total Bilirubin: 0.7 mg/dL (ref 0.2–1.2)
Total Protein: 6.9 g/dL (ref 6.0–8.3)

## 2019-02-16 LAB — TSH: TSH: 1.66 u[IU]/mL (ref 0.35–4.50)

## 2019-02-16 LAB — URIC ACID: Uric Acid, Serum: 6.5 mg/dL (ref 4.0–7.8)

## 2019-02-16 LAB — PSA: PSA: 0.65 ng/mL (ref 0.10–4.00)

## 2019-02-17 LAB — MICROALBUMIN / CREATININE URINE RATIO
Creatinine, Urine: 105 mg/dL (ref 20–320)
Microalb Creat Ratio: 2 mcg/mg creat (ref <30)
Microalb, Ur: 0.2 mg/dL

## 2019-02-17 LAB — URINALYSIS, ROUTINE W REFLEX MICROSCOPIC
Bilirubin Urine: NEGATIVE
Glucose, UA: NEGATIVE
Hgb urine dipstick: NEGATIVE
Ketones, ur: NEGATIVE
Leukocytes,Ua: NEGATIVE
Nitrite: NEGATIVE
Protein, ur: NEGATIVE
Specific Gravity, Urine: 1.018 (ref 1.001–1.03)
pH: 5.5 (ref 5.0–8.0)

## 2019-07-31 ENCOUNTER — Other Ambulatory Visit: Payer: Self-pay | Admitting: Internal Medicine

## 2019-07-31 DIAGNOSIS — M1A9XX0 Chronic gout, unspecified, without tophus (tophi): Secondary | ICD-10-CM

## 2019-07-31 MED ORDER — ALLOPURINOL 100 MG PO TABS: 150.0000 mg | ORAL_TABLET | Freq: Every day | ORAL | 3 refills | Status: DC

## 2019-08-25 ENCOUNTER — Other Ambulatory Visit: Payer: Self-pay | Admitting: Internal Medicine

## 2019-08-25 DIAGNOSIS — E119 Type 2 diabetes mellitus without complications: Secondary | ICD-10-CM

## 2019-08-25 MED ORDER — METFORMIN HCL 500 MG PO TABS: 500.0000 mg | ORAL_TABLET | Freq: Every day | ORAL | 0 refills | Status: DC

## 2019-11-27 ENCOUNTER — Telehealth: Payer: Self-pay | Admitting: Internal Medicine

## 2019-11-27 ENCOUNTER — Other Ambulatory Visit: Payer: Self-pay | Admitting: Internal Medicine

## 2019-11-27 DIAGNOSIS — E119 Type 2 diabetes mellitus without complications: Secondary | ICD-10-CM

## 2019-11-27 MED ORDER — METFORMIN HCL 500 MG PO TABS: 500.0000 mg | ORAL_TABLET | Freq: Every day | ORAL | 0 refills | Status: DC

## 2019-11-27 NOTE — Telephone Encounter (Signed)
Pt needs to schedule in person f/u for any further refills and for wellness check    sch please

## 2019-11-30 NOTE — Telephone Encounter (Signed)
Lm to call office to schedule a wellness check/medicaiton refills per Dr. Aundra Dubin.

## 2019-12-04 ENCOUNTER — Encounter: Payer: Self-pay | Admitting: Internal Medicine

## 2019-12-04 ENCOUNTER — Telehealth (INDEPENDENT_AMBULATORY_CARE_PROVIDER_SITE_OTHER): Payer: BC Managed Care – PPO | Admitting: Internal Medicine

## 2019-12-04 ENCOUNTER — Other Ambulatory Visit: Payer: Self-pay

## 2019-12-04 VITALS — Ht 68.0 in | Wt 198.0 lb

## 2019-12-04 DIAGNOSIS — E119 Type 2 diabetes mellitus without complications: Secondary | ICD-10-CM | POA: Diagnosis not present

## 2019-12-04 DIAGNOSIS — M109 Gout, unspecified: Secondary | ICD-10-CM

## 2019-12-04 DIAGNOSIS — Z1211 Encounter for screening for malignant neoplasm of colon: Secondary | ICD-10-CM

## 2019-12-04 DIAGNOSIS — E785 Hyperlipidemia, unspecified: Secondary | ICD-10-CM

## 2019-12-04 DIAGNOSIS — R001 Bradycardia, unspecified: Secondary | ICD-10-CM

## 2019-12-04 DIAGNOSIS — Z0100 Encounter for examination of eyes and vision without abnormal findings: Secondary | ICD-10-CM

## 2019-12-04 DIAGNOSIS — R7989 Other specified abnormal findings of blood chemistry: Secondary | ICD-10-CM

## 2019-12-04 DIAGNOSIS — R413 Other amnesia: Secondary | ICD-10-CM

## 2019-12-04 MED ORDER — GLUCOSE BLOOD VI STRP: ORAL_STRIP | 12 refills | Status: DC

## 2019-12-04 MED ORDER — METFORMIN HCL 500 MG PO TABS: 500.0000 mg | ORAL_TABLET | Freq: Every day | ORAL | 3 refills | Status: DC

## 2019-12-04 MED ORDER — GLUCOSE BLOOD VI STRP: ORAL_STRIP | 12 refills | Status: AC

## 2019-12-04 MED ORDER — RELION LANCETS ULTRA-THIN 30G MISC: 1.0000 | Freq: Every day | 3 refills | Status: AC

## 2019-12-04 NOTE — Progress Notes (Signed)
Telephone Note  I connected with Paul Goodwin  on 12/04/19 at 11:20 AM EDT by telephone and verified that I am speaking with the correct person using two identifiers.  Location patient: home Location provider:work or home office Persons participating in the virtual visit: patient, provider  I discussed the limitations of evaluation and management by telemedicine and the availability of in person appointments. The patient expressed understanding and agreed to proceed.   HPI: 1. DM 2 6.3 02/16/2019 on metformin 500 mg qd and cbg last week 245 doing candy, sodas and missed metformin  Referred eye MD  Needs strips and lancets   2. C/o memory loss since 59 y.o and tried medication and caused nosebleeds not sure name of medication   ROS: See pertinent positives and negatives per HPI.  Past Medical History:  Diagnosis Date  . Arthritis    right wrist  . Diabetes mellitus without complication ([place])   . History of allergic rhinitis   . History of gout   . Sleep apnea    pt doesn't wear CPAP any longer  . Wears glasses     Past Surgical History:  Procedure Laterality Date  . FRACTURE SURGERY     leg, feet, (hardware in left leg)  . HARDWARE REMOVAL     left leg  . right leg     has rod placed and then removed  . right shoulder surgery     had to reattach muscle and tendons to shoulder    Family History  Problem Relation Age of Onset  . Alcohol abuse Father   . Diabetes Mother   . Colon cancer Neg Hx   . Esophageal cancer Neg Hx   . Stomach cancer Neg Hx   . 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  allopurinol (ZYLOPRIM) 100 MG tablet, Take 1.5 tablets (150 mg total) by mouth daily., Disp: 140 tablet, Rfl: 3 .  Apple Cider Vinegar 500 MG TABS, , Disp: , Rfl:  .  B Complex-C-Folic Acid (SUPER B COMPLEX/FA/VIT C) TABS, , Disp: ,  Rfl:  .  Ciclopirox 8 % KIT, Apply daily to affected skin and nails at night >8 hours before washing and remove with alcohol every 7 days. Use for up to 48 weeks, Disp: 34.6 each, Rfl: 11 .  Coenzyme Q10 (COQ10 PO), Take by mouth daily., Disp: , Rfl:  .  cyanocobalamin 2000 MCG tablet, Take 2,000 mcg by mouth daily., Disp: , Rfl:  .  Garlic 3846 MG CAPS, , Disp: , Rfl:  .  glucose blood (ONE TOUCH TEST STRIPS) test strip, Use as instructed to test blood sugar once daily E11.9 RELION PRIME STRIPS, Disp: 100 each, Rfl: 12 .  Magnesium 250 MG TABS, , Disp: , Rfl:  .  metFORMIN (GLUCOPHAGE) 500 MG tablet, Take 1 tablet (500 mg total) by mouth daily with breakfast. appt further refills call office asap no exceptions not seen since 10/2017, Disp: 90 tablet, Rfl: 3 .  Multiple Vitamin (MULTIVITAMINS PO), Take by mouth daily., Disp: , Rfl:  .  NON FORMULARY, daily. Testosterone booster, Disp: , Rfl:  .  Omega 3 1000 MG CAPS, , Disp: , Rfl:  .  Testosterone 100 MG PLLT, , Disp: , Rfl:  .  TURMERIC PO, Take by mouth., Disp: , Rfl:  .  colchicine 0.6 MG tablet, Take  1 tablet (0.6 mg total) by mouth 2 (two) times daily as needed. (Patient not taking: Reported on 12/04/2019), Disp: 60 tablet, Rfl: 2 .  ReliOn Ultra Thin Lancets MISC, 1 Device by Does not apply route daily. Lancets approved with machine has relion machine, Disp: 100 each, Rfl: 3  EXAM:  VITALS per patient if applicable:  GENERAL: alert, oriented, appears well and in no acute distress  PSYCH/NEURO: pleasant and cooperative, no obvious depression or anxiety, speech and thought processing grossly intact  ASSESSMENT AND PLAN:  Discussed the following assessment and plan:  Type 2 diabetes mellitus without complication, without long-term current use of insulin ([place]) - Plan: Hemoglobin A1c, Ambulatory referral to Ophthalmology, metFORMIN (GLUCOPHAGE) 500 MG tablet qd, glucose blood (ONE TOUCH TEST STRIPS) test strip, ReliOn Ultra Thin Lancets  MISC Needs foot exam in future   Hyperlipidemia, unspecified hyperlipidemia type - Plan: Comprehensive metabolic panel, Lipid panel, CBC with Differential/Platelet  Bradycardia - Plan: TSH  Gout, unspecified cause, unspecified chronicity, unspecified site - Plan: Uric acic  Low testosterone - Plan: Testosterone  Memory loss Referred neurology   HM Declines vaccine (flu, pna 25, shingles, hep B vaccines, covid shot)  Tdap due PT WANTS TO THINK ABOUT   sch fasting labs  Colonoscopy had Dr. Hilarie Fredrickson 05/07/14 tubular adenoma needs f/u in 5 years  Dermatology consider in future Aks to changes mild and early rec sunscreen  Never smoker wife is exposed 2nd hand  Declines STD check PSA normal 0.65 02/16/2019   Of note h/o OSA not using cpap Memory loss referred neurology 12/03/19   -we discussed possible serious and likely etiologies, options for evaluation and workup, limitations of telemedicine visit vs in person visit, treatment, treatment risks and precautions. Pt prefers to treat via telemedicine empirically rather then risking or undertaking an in person visit at this moment. Patient agrees to seek prompt in person care if worsening, new symptoms arise, or if is not improving with treatment.   I discussed the assessment and treatment plan with the patient. The patient was provided an opportunity to ask questions and all were answered. The patient agreed with the plan and demonstrated an understanding of the instructions.   The patient was advised to call back or seek an in-person evaluation if the symptoms worsen or if the condition fails to improve as anticipated.  Time spent 20 min Delorise Jackson, MD

## 2019-12-07 ENCOUNTER — Other Ambulatory Visit: Payer: Self-pay

## 2019-12-07 ENCOUNTER — Other Ambulatory Visit (INDEPENDENT_AMBULATORY_CARE_PROVIDER_SITE_OTHER): Payer: BC Managed Care – PPO

## 2019-12-07 ENCOUNTER — Ambulatory Visit: Payer: BC Managed Care – PPO

## 2019-12-07 VITALS — BP 134/79 | HR 55 | Temp 98.1°F | Ht 68.0 in | Wt 197.0 lb

## 2019-12-07 DIAGNOSIS — R001 Bradycardia, unspecified: Secondary | ICD-10-CM

## 2019-12-07 DIAGNOSIS — M109 Gout, unspecified: Secondary | ICD-10-CM | POA: Diagnosis not present

## 2019-12-07 DIAGNOSIS — E785 Hyperlipidemia, unspecified: Secondary | ICD-10-CM

## 2019-12-07 DIAGNOSIS — R7989 Other specified abnormal findings of blood chemistry: Secondary | ICD-10-CM

## 2019-12-07 DIAGNOSIS — Z0130 Encounter for examination of blood pressure without abnormal findings: Secondary | ICD-10-CM

## 2019-12-07 DIAGNOSIS — E119 Type 2 diabetes mellitus without complications: Secondary | ICD-10-CM | POA: Diagnosis not present

## 2019-12-07 LAB — CBC WITH DIFFERENTIAL/PLATELET
Basophils Absolute: 0.1 10*3/uL (ref 0.0–0.1)
Basophils Relative: 1.3 % (ref 0.0–3.0)
Eosinophils Absolute: 0.2 10*3/uL (ref 0.0–0.7)
Eosinophils Relative: 4.6 % (ref 0.0–5.0)
HCT: 42.5 % (ref 39.0–52.0)
Hemoglobin: 14.4 g/dL (ref 13.0–17.0)
Lymphocytes Relative: 23.9 % (ref 12.0–46.0)
Lymphs Abs: 1.2 10*3/uL (ref 0.7–4.0)
MCHC: 34 g/dL (ref 30.0–36.0)
MCV: 94.3 fl (ref 78.0–100.0)
Monocytes Absolute: 0.4 10*3/uL (ref 0.1–1.0)
Monocytes Relative: 7.7 % (ref 3.0–12.0)
Neutro Abs: 3.1 10*3/uL (ref 1.4–7.7)
Neutrophils Relative %: 62.5 % (ref 43.0–77.0)
Platelets: 251 10*3/uL (ref 150.0–400.0)
RBC: 4.51 Mil/uL (ref 4.22–5.81)
RDW: 13.3 % (ref 11.5–15.5)
WBC: 5 10*3/uL (ref 4.0–10.5)

## 2019-12-07 LAB — URIC ACID: Uric Acid, Serum: 6.3 mg/dL (ref 4.0–7.8)

## 2019-12-07 LAB — COMPREHENSIVE METABOLIC PANEL
ALT: 17 U/L (ref 0–53)
AST: 16 U/L (ref 0–37)
Albumin: 4.6 g/dL (ref 3.5–5.2)
Alkaline Phosphatase: 79 U/L (ref 39–117)
BUN: 18 mg/dL (ref 6–23)
CO2: 28 mEq/L (ref 19–32)
Calcium: 9.8 mg/dL (ref 8.4–10.5)
Chloride: 102 mEq/L (ref 96–112)
Creatinine, Ser: 1.08 mg/dL (ref 0.40–1.50)
GFR: 70.35 mL/min (ref >60.00)
Glucose, Bld: 112 mg/dL — ABNORMAL HIGH (ref 70–99)
Potassium: 5.3 mEq/L — ABNORMAL HIGH (ref 3.5–5.1)
Sodium: 138 mEq/L (ref 135–145)
Total Bilirubin: 0.8 mg/dL (ref 0.2–1.2)
Total Protein: 6.8 g/dL (ref 6.0–8.3)

## 2019-12-07 LAB — LIPID PANEL
Cholesterol: 242 mg/dL — ABNORMAL HIGH (ref 0–200)
HDL: 53.9 mg/dL (ref >39.00)
LDL Cholesterol: 167 mg/dL — ABNORMAL HIGH (ref 0–99)
NonHDL: 187.6
Total CHOL/HDL Ratio: 4
Triglycerides: 104 mg/dL (ref 0.0–149.0)
VLDL: 20.8 mg/dL (ref 0.0–40.0)

## 2019-12-07 LAB — TSH: TSH: 1.69 u[IU]/mL (ref 0.35–4.50)

## 2019-12-07 LAB — HEMOGLOBIN A1C: Hgb A1c MFr Bld: 6.1 % (ref 4.6–6.5)

## 2019-12-07 LAB — TESTOSTERONE: Testosterone: 383.97 ng/dL (ref 300.00–890.00)

## 2019-12-07 NOTE — Progress Notes (Signed)
Thank you 134/79 noted Will discuss with patient  [place]

## 2019-12-07 NOTE — Progress Notes (Signed)
Patient came in to have vitals checked per provider request.

## 2019-12-08 ENCOUNTER — Encounter: Payer: Self-pay | Admitting: Internal Medicine

## 2019-12-08 NOTE — Telephone Encounter (Signed)
-----   Message from Delorise Jackson, MD sent at 12/08/2019  9:13 AM EDT ----- Gout # down  Cholesterol elevated -is he agreeable to add cholesterol medication and medication low dose for blood pressure BP visit top 134 goal <130/<80?  Blood cts normal  A1C 6.1 down and improved  Testosterone normal  Thyroid lab normal

## 2019-12-14 ENCOUNTER — Other Ambulatory Visit: Payer: Self-pay | Admitting: Internal Medicine

## 2019-12-14 DIAGNOSIS — E785 Hyperlipidemia, unspecified: Secondary | ICD-10-CM

## 2019-12-14 MED ORDER — ATORVASTATIN CALCIUM 10 MG PO TABS: 10.0000 mg | ORAL_TABLET | Freq: Every day | ORAL | 3 refills | Status: DC

## 2020-01-04 ENCOUNTER — Telehealth: Payer: Self-pay | Admitting: Internal Medicine

## 2020-01-04 NOTE — Telephone Encounter (Signed)
Faxed

## 2020-01-04 NOTE — Telephone Encounter (Signed)
Pt needs a copy of his last A1C faxed to [facility] [phone]) Pt needs this for his Dot cpe

## 2020-01-18 DIAGNOSIS — E119 Type 2 diabetes mellitus without complications: Secondary | ICD-10-CM | POA: Diagnosis not present

## 2020-01-18 LAB — HM DIABETES EYE EXAM

## 2020-01-19 ENCOUNTER — Encounter: Payer: Self-pay | Admitting: Internal Medicine

## 2020-02-15 ENCOUNTER — Encounter: Payer: Self-pay | Admitting: Neurology

## 2020-02-15 ENCOUNTER — Telehealth: Payer: Self-pay | Admitting: Neurology

## 2020-02-15 ENCOUNTER — Other Ambulatory Visit: Payer: Self-pay

## 2020-02-15 ENCOUNTER — Ambulatory Visit (INDEPENDENT_AMBULATORY_CARE_PROVIDER_SITE_OTHER): Payer: BC Managed Care – PPO | Admitting: Neurology

## 2020-02-15 VITALS — BP 120/87 | HR 53 | Ht 68.0 in | Wt 194.0 lb

## 2020-02-15 DIAGNOSIS — R419 Unspecified symptoms and signs involving cognitive functions and awareness: Secondary | ICD-10-CM | POA: Diagnosis not present

## 2020-02-15 DIAGNOSIS — R413 Other amnesia: Secondary | ICD-10-CM

## 2020-02-15 NOTE — Telephone Encounter (Signed)
LVM for pt to call back about scheduling mri  [employer] auth: [place] Ref # [id number]

## 2020-02-15 NOTE — Patient Instructions (Addendum)
MRI of the brain Blood work Highly recommend [facility] for therapy and medical management using possibly medications for ADHD or other medications that may help   Attention Deficit Hyperactivity Disorder, Adult Attention deficit hyperactivity disorder (ADHD) is a mental health disorder that starts during childhood (neurodevelopmental disorder). For many people with ADHD, the disorder continues into the adult years. Treatment can help you manage your symptoms. What are the causes? The exact cause of ADHD is not known. Most experts believe genetics and environmental factors contribute to ADHD. What increases the risk? The following factors may make you more likely to develop this condition:  Having a family history of ADHD.  Being male.  Being born to a mother who smoked or drank alcohol during pregnancy.  Being exposed to lead or other toxins in the womb or early in life.  Being born before 21 weeks of pregnancy (prematurely) or at a low birth weight.  Having experienced a brain injury. What are the signs or symptoms? Symptoms of this condition depend on the type of ADHD. The two main types are inattentive and hyperactive-impulsive. Some people may have symptoms of both types. Symptoms of the inattentive type include:  Difficulty paying attention.  Making careless mistakes.  Not following instructions.  Being disorganized.  Avoiding tasks that require time and attention.  Losing and forgetting things.  Being easily distracted. Symptoms of the hyperactive-impulsive type include:  Restlessness.  Talking too much.  Interrupting.  Difficulty with: ? Sitting still. ? Feeling motivated. ? Relaxing. ? Waiting in line or waiting for a turn. In adults, this condition may lead to certain problems, such as:  Keeping jobs.  Performing tasks at work.  Having stable relationships.  Being on time or keeping to a schedule. How is this  diagnosed? This condition is diagnosed based on your current symptoms and your history of symptoms. The diagnosis can be made by a health care provider such as a primary care provider or a mental health care specialist. Your health care provider may use a symptom checklist or a behavior rating scale to evaluate your symptoms. He or she may also want to talk with people who have observed your behaviors throughout your life. How is this treated? This condition can be treated with medicines and behavior therapy. Medicines may be the best option to reduce impulsive behaviors and improve attention. Your health care provider may recommend:  Stimulant medicines. These are the most common medicines used for adult ADHD. They affect certain chemicals in the brain (neurotransmitters) and improve your ability to control your symptoms.  A non-stimulant medicine for adult ADHD (atomoxetine). This medicine increases a neurotransmitter called norepinephrine. It may take weeks to months to see effects from this medicine. Counseling and behavioral management are also important for treating ADHD. Counseling is often used along with medicine. Your health care provider may suggest:  Cognitive behavioral therapy (CBT). This type of therapy teaches you to replace negative thoughts and actions with positive thoughts and actions. When used as part of ADHD treatment, this therapy may also include: ? Coping strategies for organization, time management, impulse control, and stress reduction. ? Mindfulness and meditation training.  Behavioral management. You may work with a Leisure centre manager who is specially trained to help people with ADHD manage and organize activities and function more effectively. Follow these instructions at home: Medicines   Take over-the-counter and prescription medicines only as told by your health care provider.  Talk with your health care provider about the possible  side effects of your medicines and how to  manage them. Lifestyle   Do not use drugs.  Do not drink alcohol if: ? Your health care provider tells you not to drink. ? You are pregnant, may be pregnant, or are planning to become pregnant.  If you drink alcohol: ? Limit how much you use to:  0-1 drink a day for women.  0-2 drinks a day for men. ? Be aware of how much alcohol is in your drink. In the [place] one drink equals one 12 oz bottle of beer (355 mL), one 5 oz glass of wine (148 mL), or one 1 oz glass of hard liquor (44 mL).  Get enough sleep.  Eat a healthy diet.  Exercise regularly. Exercise can help to reduce stress and anxiety. General instructions  Learn as much as you can about adult ADHD, and work closely with your health care providers to find the treatments that work best for you.  Follow the same schedule each day.  Use reminder devices like notes, calendars, and phone apps to stay on time and organized.  Keep all follow-up visits as told by your health care provider and therapist. This is important. Where to find more information A health care provider may be able to recommend resources that are available online or over the phone. You could start with:  Attention Deficit Disorder Association (ADDA): PubAddiction.co.nz  National Institute of Mental Health [facility]): https://carter.com/ Contact a health care provider if:  Your symptoms continue to cause problems.  You have side effects from your medicine, such as: ? Repeated muscle twitches, coughing, or speech outbursts. ? Sleep problems. ? Loss of appetite. ? Dizziness. ? Unusually fast heartbeat. ? Stomach pains. ? Headaches.  You are struggling with anxiety, depression, or substance abuse. Get help right away if you:  Have a severe reaction to a medicine. If you ever feel like you may hurt yourself or others, or have thoughts about taking your own life, get help right away. You can go to the nearest emergency department or call:  Your local  emergency services (911 in the [place]  A suicide crisis helpline, such as the [place] at 431-468-3652. This is open 24 hours a day. Summary  ADHD is a mental health disorder that starts during childhood (neurodevelopmental disorder) and often continues into the adult years.  The exact cause of ADHD is not known. Most experts believe genetics and environmental factors contribute to ADHD.  There is no cure for ADHD, but treatment with medicine, cognitive behavioral therapy, or behavioral management can help you manage your condition. This information is not intended to replace advice given to you by your health care provider. Make sure you discuss any questions you have with your health care provider. Document Revised: 09/01/2018 Document Reviewed: 09/01/2018 Elsevier Patient Education  [place].
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
## 2020-02-23 LAB — BASIC METABOLIC PANEL
BUN/Creatinine Ratio: 14 (ref 9–20)
BUN: 15 mg/dL (ref 6–24)
CO2: 27 mmol/L (ref 20–29)
Calcium: 10.1 mg/dL (ref 8.7–10.2)
Chloride: 102 mmol/L (ref 96–106)
Creatinine, Ser: 1.08 mg/dL (ref 0.76–1.27)
GFR calc Af Amer: 88 mL/min/{1.73_m2} (ref >59)
GFR calc non Af Amer: 76 mL/min/{1.73_m2} (ref >59)
Glucose: 118 mg/dL — ABNORMAL HIGH (ref 65–99)
Potassium: 4.7 mmol/L (ref 3.5–5.2)
Sodium: 141 mmol/L (ref 134–144)

## 2020-02-23 LAB — HIV ANTIBODY (ROUTINE TESTING W REFLEX): HIV Screen 4th Generation wRfx: NONREACTIVE

## 2020-02-23 LAB — METHYLMALONIC ACID, SERUM: Methylmalonic Acid: 99 nmol/L (ref 0–378)

## 2020-02-23 LAB — VITAMIN D 25 HYDROXY (VIT D DEFICIENCY, FRACTURES): Vit D, 25-Hydroxy: 48.5 ng/mL (ref 30.0–100.0)

## 2020-02-23 LAB — B12 AND FOLATE PANEL
Folate: >20 ng/mL (ref >3.0)
Vitamin B-12: 1484 pg/mL — ABNORMAL HIGH (ref 232–1245)

## 2020-02-23 LAB — VITAMIN B1: Thiamine: 165 nmol/L (ref 66.5–200.0)

## 2020-02-23 LAB — RPR: RPR Ser Ql: NONREACTIVE

## 2020-06-09 ENCOUNTER — Telehealth (INDEPENDENT_AMBULATORY_CARE_PROVIDER_SITE_OTHER): Payer: BC Managed Care – PPO | Admitting: Internal Medicine

## 2020-06-09 ENCOUNTER — Other Ambulatory Visit: Payer: Self-pay

## 2020-06-09 ENCOUNTER — Encounter: Payer: Self-pay | Admitting: Internal Medicine

## 2020-06-09 VITALS — Ht 68.0 in | Wt 167.0 lb

## 2020-06-09 DIAGNOSIS — M109 Gout, unspecified: Secondary | ICD-10-CM | POA: Diagnosis not present

## 2020-06-09 DIAGNOSIS — Z1329 Encounter for screening for other suspected endocrine disorder: Secondary | ICD-10-CM | POA: Diagnosis not present

## 2020-06-09 DIAGNOSIS — Z0000 Encounter for general adult medical examination without abnormal findings: Secondary | ICD-10-CM | POA: Diagnosis not present

## 2020-06-09 DIAGNOSIS — E119 Type 2 diabetes mellitus without complications: Secondary | ICD-10-CM | POA: Diagnosis not present

## 2020-06-09 DIAGNOSIS — Z125 Encounter for screening for malignant neoplasm of prostate: Secondary | ICD-10-CM

## 2020-06-09 DIAGNOSIS — R413 Other amnesia: Secondary | ICD-10-CM

## 2020-06-09 DIAGNOSIS — Z1389 Encounter for screening for other disorder: Secondary | ICD-10-CM

## 2020-06-09 DIAGNOSIS — R7989 Other specified abnormal findings of blood chemistry: Secondary | ICD-10-CM

## 2020-06-09 NOTE — Progress Notes (Addendum)
Virtual Visit via Video Note  I connected with Paul Goodwin  on 06/09/20 at  8:10 AM EST by a video enabled telemedicine application and verified that I am speaking with the correct person using two identifiers.  Location patient: truck Location provider:work or home office Persons participating in the virtual visit: patient, provider  I discussed the limitations of evaluation and management by telemedicine and the availability of in person appointments. The patient expressed understanding and agreed to proceed.   HPI: Annual  1. Doing well  2. Memory loss chronic since a kid Dr. Jaynee Eagles was c/w ADHD/memory loss of him not using cpap with h/o OSA. He also had h/o TBI and mom took abortion pills which killed his twin declines to see psych for w/u ADHD  3. DM 2 cbg fasting 84 and after meals 111 doing keto like diet and lost 30 lbs and 1x per week gym heavy weight reps x 2 hours  On metformin 500 mg qd and wants to get off   -COVID-19 vaccine status:declines  ROS: See pertinent positives and negatives per HPI.  Past Medical History:  Diagnosis Date  . Arthritis    right wrist  . Diabetes mellitus without complication ([place])   . History of allergic rhinitis   . History of gout   . Sleep apnea    pt doesn't wear CPAP any longer  . Wears glasses     Past Surgical History:  Procedure Laterality Date  . FRACTURE SURGERY     leg, feet, (hardware in left leg); 6 operations total per pt report  . HARDWARE REMOVAL     left leg  . HERNIA REPAIR    . right leg     has rod placed and then removed  . right shoulder surgery     had to reattach muscle and tendons to shoulder     Current Outpatient Medications:  .  allopurinol (ZYLOPRIM) 100 MG tablet, Take 1.5 tablets (150 mg total) by mouth daily., Disp: 140 tablet, Rfl: 3 .  Apple Cider Vinegar 500 MG TABS, , Disp: , Rfl:  .  B Complex-C-Folic Acid (SUPER B COMPLEX/FA/VIT C) TABS, , Disp: , Rfl:  .  Coenzyme Q10 (COQ10 PO), Take  by mouth daily., Disp: , Rfl:  .  cyanocobalamin 2000 MCG tablet, Take 2,000 mcg by mouth daily., Disp: , Rfl:  .  Garlic 2992 MG CAPS, , Disp: , Rfl:  .  Magnesium 250 MG TABS, , Disp: , Rfl:  .  metFORMIN (GLUCOPHAGE) 500 MG tablet, Take 1 tablet (500 mg total) by mouth daily with breakfast. appt further refills call office asap no exceptions not seen since 10/2017, Disp: 90 tablet, Rfl: 3 .  Multiple Vitamin (MULTIVITAMINS PO), Take by mouth daily., Disp: , Rfl:  .  NON FORMULARY, daily. Testosterone booster, Disp: , Rfl:  .  Omega 3 1000 MG CAPS, , Disp: , Rfl:  .  Testosterone 100 MG PLLT, , Disp: , Rfl:  .  TURMERIC PO, Take by mouth., Disp: , Rfl:  .  UNABLE TO FIND, Med Name: extra virgin olive oil 1 tablespoon, Disp: , Rfl:  .  glucose blood (ONE TOUCH TEST STRIPS) test strip, Use as instructed to test blood sugar once daily E11.9 RELION PRIME STRIPS, Disp: 100 each, Rfl: 12 .  ReliOn Ultra Thin Lancets MISC, 1 Device by Does not apply route daily. Lancets approved with machine has relion machine, Disp: 100 each, Rfl: 3  EXAM:  VITALS per patient  if applicable:  GENERAL: alert, oriented, appears well and in no acute distress  HEENT: atraumatic, conjunttiva clear, no obvious abnormalities on inspection of external nose and ears  NECK: normal movements of the head and neck  LUNGS: on inspection no signs of respiratory distress, breathing rate appears normal, no obvious gross SOB, gasping or wheezing  CV: no obvious cyanosis  MS: moves all visible extremities without noticeable abnormality  PSYCH/NEURO: pleasant and cooperative, no obvious depression or anxiety, speech and thought processing grossly intact  ASSESSMENT AND PLAN:  Discussed the following assessment and plan:  Annual physical exam - Plan: Comprehensive metabolic panel, Lipid panel, CBC w/Diff, Urinalysis, Routine w reflex microscopic, Microalbumin / creatinine urine ratio  Declines vaccine (flu, pna 23,  shingles, hep B vaccines, covid shot)  Tdap due PT WANTS TO THINK ABOUT   sch fasting labs asap Colonoscopy had Dr. Hilarie Fredrickson 05/07/14 tubular adenoma needs f/u in 5 years  -as of 06/09/20 pt will let me know when ready for this   Dermatology consider in futureAks to changes mild and early rec sunscreen Never smoker wife is exposed 2nd hand  Declines STD check PSA normal 0.65 02/16/2019   rec healthy diet and exercise   Type 2 diabetes mellitus without complication, without long-term current use of insulin ([place]) - Plan: Comprehensive metabolic panel, Lipid panel, Hemoglobin A1c, Urinalysis, Routine w reflex microscopic, Microalbumin / creatinine urine ratio He wants to try to get off metformin has lost 30 lbs by doing keto diet and exercise with weights  On metformin 500 mg qd   Gout, unspecified cause, unspecified chronicity, unspecified site - Plan: CBC w/Diff, Uric acid On allopurinol 150 mg qd   Memory loss Not wearing cpap, declines psych for adhd testing, declines neurology f/u did have consult Has been chronic issue since childhood  Low testosterone - Plan: Testosterone He states taking 200 mg qd helps him sleep      -we discussed possible serious and likely etiologies, options for evaluation and workup, limitations of telemedicine visit vs in person visit, treatment, treatment risks and precautions.    I discussed the assessment and treatment plan with the patient. The patient was provided an opportunity to ask questions and all were answered. The patient agreed with the plan and demonstrated an understanding of the instructions.    Time spent 30 min Delorise Jackson, MD      Provider: Dr. Olivia Mackie [facility]

## 2020-06-09 NOTE — Patient Instructions (Signed)
Tdap (Tetanus, Diphtheria, Pertussis) Vaccine: What You Need to Know 1. Why get vaccinated? Tdap vaccine can prevent tetanus, diphtheria, and pertussis. Diphtheria and pertussis spread from person to person. Tetanus enters the body through cuts or wounds.  TETANUS (T) causes painful stiffening of the muscles. Tetanus can lead to serious health problems, including being unable to open the mouth, having trouble swallowing and breathing, or death.  DIPHTHERIA (D) can lead to difficulty breathing, heart failure, paralysis, or death.  PERTUSSIS (aP), also known as "whooping cough," can cause uncontrollable, violent coughing that makes it hard to breathe, eat, or drink. Pertussis can be extremely serious especially in babies and young children, causing pneumonia, convulsions, brain damage, or death. In teens and adults, it can cause weight loss, loss of bladder control, passing out, and rib fractures from severe coughing. 2. Tdap vaccine Tdap is only for children 7 years and older, adolescents, and adults.  Adolescents should receive a single dose of Tdap, preferably at age 11 or 12 years. Pregnant people should get a dose of Tdap during every pregnancy, preferably during the early part of the third trimester, to help protect the newborn from pertussis. Infants are most at risk for severe, life-threatening complications from pertussis. Adults who have never received Tdap should get a dose of Tdap. Also, adults should receive a booster dose of either Tdap or Td (a different vaccine that protects against tetanus and diphtheria but not pertussis) every 10 years, or after 5 years in the case of a severe or dirty wound or burn. Tdap may be given at the same time as other vaccines. 3. Talk with your health care provider Tell your vaccine provider if the person getting the vaccine:  Has had an allergic reaction after a previous dose of any vaccine that protects against tetanus, diphtheria, or pertussis, or  has any severe, life-threatening allergies  Has had a coma, decreased level of consciousness, or prolonged seizures within 7 days after a previous dose of any pertussis vaccine (DTP, DTaP, or Tdap)  Has seizures or another nervous system problem  Has ever had Guillain-Barr Syndrome (also called "GBS")  Has had severe pain or swelling after a previous dose of any vaccine that protects against tetanus or diphtheria In some cases, your health care provider may decide to postpone Tdap vaccination until a future visit. People with minor illnesses, such as a cold, may be vaccinated. People who are moderately or severely ill should usually wait until they recover before getting Tdap vaccine.  Your health care provider can give you more information. 4. Risks of a vaccine reaction  Pain, redness, or swelling where the shot was given, mild fever, headache, feeling tired, and nausea, vomiting, diarrhea, or stomachache sometimes happen after Tdap vaccination. People sometimes faint after medical procedures, including vaccination. Tell your provider if you feel dizzy or have vision changes or ringing in the ears.  As with any medicine, there is a very remote chance of a vaccine causing a severe allergic reaction, other serious injury, or death. 5. What if there is a serious problem? An allergic reaction could occur after the vaccinated person leaves the clinic. If you see signs of a severe allergic reaction (hives, swelling of the face and throat, difficulty breathing, a fast heartbeat, dizziness, or weakness), call 9-1-1 and get the person to the nearest hospital. For other signs that concern you, call your health care provider.  Adverse reactions should be reported to the Vaccine Adverse Event Reporting System (VAERS). Your health   care provider will usually file this report, or you can do it yourself. Visit the VAERS website at www.vaers.hhs.gov or call 1-800-822-7967. VAERS is only for reporting  reactions, and VAERS staff members do not give medical advice. 6. The National Vaccine Injury Compensation Program The National Vaccine Injury Compensation Program (VICP) is a federal program that was created to compensate people who may have been injured by certain vaccines. Claims regarding alleged injury or death due to vaccination have a time limit for filing, which may be as short as two years. Visit the VICP website at www.hrsa.gov/vaccinecompensation or call 1-800-338-2382 to learn about the program and about filing a claim. 7. How can I learn more?  Ask your health care provider.  Call your local or state health department.  Visit the website of the Food and Drug Administration (FDA) for vaccine package inserts and additional information at www.fda.gov/vaccines-blood-biologics/vaccines.  Contact the Centers for Disease Control and Prevention (CDC): ? Call 1-800-232-4636 (1-800-CDC-INFO) or ? Visit CDC's website at www.cdc.gov/vaccines. Vaccine Information Statement Tdap (Tetanus, Diphtheria, Pertussis) Vaccine (11/27/2019) This information is not intended to replace advice given to you by your health care provider. Make sure you discuss any questions you have with your health care provider. Document Revised: 12/23/2019 Document Reviewed: 12/23/2019 Elsevier Patient Education  2021 [employer]  

## 2020-06-13 ENCOUNTER — Other Ambulatory Visit: Payer: Self-pay

## 2020-06-13 ENCOUNTER — Other Ambulatory Visit (INDEPENDENT_AMBULATORY_CARE_PROVIDER_SITE_OTHER): Payer: BC Managed Care – PPO

## 2020-06-13 DIAGNOSIS — E119 Type 2 diabetes mellitus without complications: Secondary | ICD-10-CM | POA: Diagnosis not present

## 2020-06-13 DIAGNOSIS — Z1389 Encounter for screening for other disorder: Secondary | ICD-10-CM

## 2020-06-13 DIAGNOSIS — M109 Gout, unspecified: Secondary | ICD-10-CM | POA: Diagnosis not present

## 2020-06-13 DIAGNOSIS — Z1329 Encounter for screening for other suspected endocrine disorder: Secondary | ICD-10-CM | POA: Diagnosis not present

## 2020-06-13 DIAGNOSIS — R7989 Other specified abnormal findings of blood chemistry: Secondary | ICD-10-CM | POA: Diagnosis not present

## 2020-06-13 DIAGNOSIS — Z0000 Encounter for general adult medical examination without abnormal findings: Secondary | ICD-10-CM | POA: Diagnosis not present

## 2020-06-13 DIAGNOSIS — Z125 Encounter for screening for malignant neoplasm of prostate: Secondary | ICD-10-CM | POA: Diagnosis not present

## 2020-06-13 LAB — CBC WITH DIFFERENTIAL/PLATELET
Basophils Absolute: 0.1 10*3/uL (ref 0.0–0.1)
Basophils Relative: 1.5 % (ref 0.0–3.0)
Eosinophils Absolute: 0.3 10*3/uL (ref 0.0–0.7)
Eosinophils Relative: 6.5 % — ABNORMAL HIGH (ref 0.0–5.0)
HCT: 43.6 % (ref 39.0–52.0)
Hemoglobin: 14.6 g/dL (ref 13.0–17.0)
Lymphocytes Relative: 24.9 % (ref 12.0–46.0)
Lymphs Abs: 1.1 10*3/uL (ref 0.7–4.0)
MCHC: 33.5 g/dL (ref 30.0–36.0)
MCV: 93.9 fl (ref 78.0–100.0)
Monocytes Absolute: 0.4 10*3/uL (ref 0.1–1.0)
Monocytes Relative: 8.2 % (ref 3.0–12.0)
Neutro Abs: 2.6 10*3/uL (ref 1.4–7.7)
Neutrophils Relative %: 58.9 % (ref 43.0–77.0)
Platelets: 233 10*3/uL (ref 150.0–400.0)
RBC: 4.64 Mil/uL (ref 4.22–5.81)
RDW: 13.1 % (ref 11.5–15.5)
WBC: 4.5 10*3/uL (ref 4.0–10.5)

## 2020-06-13 LAB — COMPREHENSIVE METABOLIC PANEL
ALT: 15 U/L (ref 0–53)
AST: 18 U/L (ref 0–37)
Albumin: 4.8 g/dL (ref 3.5–5.2)
Alkaline Phosphatase: 86 U/L (ref 39–117)
BUN: 21 mg/dL (ref 6–23)
CO2: 28 mEq/L (ref 19–32)
Calcium: 9.9 mg/dL (ref 8.4–10.5)
Chloride: 101 mEq/L (ref 96–112)
Creatinine, Ser: 0.95 mg/dL (ref 0.40–1.50)
GFR: 88.54 mL/min (ref >60.00)
Glucose, Bld: 96 mg/dL (ref 70–99)
Potassium: 4.6 mEq/L (ref 3.5–5.1)
Sodium: 138 mEq/L (ref 135–145)
Total Bilirubin: 0.9 mg/dL (ref 0.2–1.2)
Total Protein: 6.9 g/dL (ref 6.0–8.3)

## 2020-06-13 LAB — URIC ACID: Uric Acid, Serum: 7 mg/dL (ref 4.0–7.8)

## 2020-06-13 LAB — LIPID PANEL
Cholesterol: 307 mg/dL — ABNORMAL HIGH (ref 0–200)
HDL: 53.6 mg/dL (ref >39.00)
LDL Cholesterol: 231 mg/dL — ABNORMAL HIGH (ref 0–99)
NonHDL: 253.54
Total CHOL/HDL Ratio: 6
Triglycerides: 114 mg/dL (ref 0.0–149.0)
VLDL: 22.8 mg/dL (ref 0.0–40.0)

## 2020-06-13 LAB — HEMOGLOBIN A1C: Hgb A1c MFr Bld: 5.6 % (ref 4.6–6.5)

## 2020-06-13 LAB — PSA: PSA: 0.54 ng/mL (ref 0.10–4.00)

## 2020-06-13 LAB — TSH: TSH: 0.99 u[IU]/mL (ref 0.35–4.50)

## 2020-06-13 LAB — TESTOSTERONE: Testosterone: 439.43 ng/dL (ref 300.00–890.00)

## 2020-06-14 LAB — URINALYSIS, ROUTINE W REFLEX MICROSCOPIC
Bilirubin, UA: NEGATIVE
Glucose, UA: NEGATIVE
Leukocytes,UA: NEGATIVE
Nitrite, UA: NEGATIVE
Protein,UA: NEGATIVE
RBC, UA: NEGATIVE
Specific Gravity, UA: 1.021 (ref 1.005–1.030)
Urobilinogen, Ur: 0.2 mg/dL (ref 0.2–1.0)
pH, UA: 5.5 (ref 5.0–7.5)

## 2020-06-14 LAB — MICROALBUMIN / CREATININE URINE RATIO
Creatinine, Urine: 122.3 mg/dL
Microalb/Creat Ratio: 3 mg/g creat (ref 0–29)
Microalbumin, Urine: 4 ug/mL

## 2020-06-20 ENCOUNTER — Other Ambulatory Visit: Payer: Self-pay | Admitting: Internal Medicine

## 2020-06-20 ENCOUNTER — Encounter: Payer: Self-pay | Admitting: Internal Medicine

## 2020-06-20 DIAGNOSIS — R413 Other amnesia: Secondary | ICD-10-CM

## 2020-11-25 ENCOUNTER — Telehealth (INDEPENDENT_AMBULATORY_CARE_PROVIDER_SITE_OTHER): Payer: BC Managed Care – PPO | Admitting: Internal Medicine

## 2020-11-25 ENCOUNTER — Encounter: Payer: Self-pay | Admitting: Internal Medicine

## 2020-11-25 VITALS — Ht 67.99 in | Wt 172.7 lb

## 2020-11-25 DIAGNOSIS — E785 Hyperlipidemia, unspecified: Secondary | ICD-10-CM | POA: Diagnosis not present

## 2020-11-25 DIAGNOSIS — Z794 Long term (current) use of insulin: Secondary | ICD-10-CM

## 2020-11-25 DIAGNOSIS — E119 Type 2 diabetes mellitus without complications: Secondary | ICD-10-CM | POA: Diagnosis not present

## 2020-11-25 DIAGNOSIS — Z1211 Encounter for screening for malignant neoplasm of colon: Secondary | ICD-10-CM | POA: Diagnosis not present

## 2020-11-25 DIAGNOSIS — M109 Gout, unspecified: Secondary | ICD-10-CM | POA: Diagnosis not present

## 2020-11-25 MED ORDER — EZETIMIBE 10 MG PO TABS: 10.0000 mg | ORAL_TABLET | Freq: Every day | ORAL | 3 refills | Status: DC

## 2020-11-25 NOTE — Patient Instructions (Signed)
High Cholesterol  High cholesterol is a condition in which the blood has high levels of a white, waxy substance similar to fat (cholesterol). The liver makes all the cholesterol that the body needs. The human body needs small amounts of cholesterol to help build cells. A person gets extra orexcess cholesterol from the food that he or she eats. The blood carries cholesterol from the liver to the rest of the body. If you have high cholesterol, deposits (plaques) may build up on the walls of your arteries. Arteries are the blood vessels that carry blood away from your heart. These plaques make the arteries narrowand stiff. Cholesterol plaques increase your risk for heart attack and stroke. Work withyour health care provider to keep your cholesterol levels in a healthy range. What increases the risk? The following factors may make you more likely to develop this condition: Eating foods that are high in animal fat (saturated fat) or cholesterol. Being overweight. Not getting enough exercise. A family history of high cholesterol (familial hypercholesterolemia). Use of tobacco products. Having diabetes. What are the signs or symptoms? There are no symptoms of this condition. How is this diagnosed? This condition may be diagnosed based on the results of a blood test. If you are older than 58 years of age, your health care provider may check your cholesterol levels every 4-6 years. You may be checked more often if you have high cholesterol or other risk factors for heart disease. The blood test for cholesterol measures: "Bad" cholesterol, or LDL cholesterol. This is the main type of cholesterol that causes heart disease. The desired level is less than 100 mg/dL. "Good" cholesterol, or HDL cholesterol. HDL helps protect against heart disease by cleaning the arteries and carrying the LDL to the liver for processing. The desired level for HDL is 60 mg/dL or higher. Triglycerides. These are fats that your  body can store or burn for energy. The desired level is less than 150 mg/dL. Total cholesterol. This measures the total amount of cholesterol in your blood and includes LDL, HDL, and triglycerides. The desired level is less than 200 mg/dL. How is this treated? This condition may be treated with: Diet changes. You may be asked to eat foods that have more fiber and less saturated fats or added sugar. Lifestyle changes. These may include regular exercise, maintaining a healthy weight, and quitting use of tobacco products. Medicines. These are given when diet and lifestyle changes have not worked. You may be prescribed a statin medicine to help lower your cholesterol levels. Follow these instructions at home: Eating and drinking  Eat a healthy, balanced diet. This diet includes: Daily servings of a variety of fresh, frozen, or canned fruits and vegetables. Daily servings of whole grain foods that are rich in fiber. Foods that are low in saturated fats and trans fats. These include poultry and fish without skin, lean cuts of meat, and low-fat dairy products. A variety of fish, especially oily fish that contain omega-3 fatty acids. Aim to eat fish at least 2 times a week. Avoid foods and drinks that have added sugar. Use healthy cooking methods, such as roasting, grilling, broiling, baking, poaching, steaming, and stir-frying. Do not fry your food except for stir-frying.  Lifestyle  Get regular exercise. Aim to exercise for a total of 150 minutes a week. Increase your activity level by doing activities such as gardening, walking, and taking the stairs. Do not use any products that contain nicotine or tobacco, such as cigarettes, e-cigarettes, and chewing tobacco.   If you need help quitting, ask your health care provider.  General instructions Take over-the-counter and prescription medicines only as told by your health care provider. Keep all follow-up visits as told by your health care provider.  This is important. Where to find more information American Heart Association: www.heart.org National Heart, Lung, and Blood Institute: https://wilson-eaton.com/ Contact a health care provider if: You have trouble achieving or maintaining a healthy diet or weight. You are starting an exercise program. You are unable to stop smoking. Get help right away if: You have chest pain. You have trouble breathing. You have any symptoms of a stroke. "BE FAST" is an easy way to remember the main warning signs of a stroke: B - Balance. Signs are dizziness, sudden trouble walking, or loss of balance. E - Eyes. Signs are trouble seeing or a sudden change in vision. F - Face. Signs are sudden weakness or numbness of the face, or the face or eyelid drooping on one side. A - Arms. Signs are weakness or numbness in an arm. This happens suddenly and usually on one side of the body. S - Speech. Signs are sudden trouble speaking, slurred speech, or trouble understanding what people say. T - Time. Time to call emergency services. Write down what time symptoms started. You have other signs of a stroke, such as: A sudden, severe headache with no known cause. Nausea or vomiting. Seizure. These symptoms may represent a serious problem that is an emergency. Do not wait to see if the symptoms will go away. Get medical help right away. Call your local emergency services (911 in the [place] Do not drive yourself to the hospital. Summary Cholesterol plaques increase your risk for heart attack and stroke. Work with your health care provider to keep your cholesterol levels in a healthy range. Eat a healthy, balanced diet, get regular exercise, and maintain a healthy weight. Do not use any products that contain nicotine or tobacco, such as cigarettes, e-cigarettes, and chewing tobacco. Get help right away if you have any symptoms of a stroke. This information is not intended to replace advice given to you by your health care  provider. Make sure you discuss any questions you have with your healthcare provider. Document Revised: 03/09/2019 Document Reviewed: 03/09/2019 Elsevier Patient Education  [place].  Cholesterol Content in Foods Cholesterol is a waxy, fat-like substance that helps to carry fat in the blood. The body needs cholesterol in small amounts, but too much cholesterol can causedamage to the arteries and heart. Most people should eat less than 200 milligrams (mg) of cholesterol a day. Foods with cholesterol  Cholesterol is found in animal-based foods, such as meat, seafood, and dairy. Generally, low-fat dairy and lean meats have less cholesterol than full-fat dairy and fatty meats. The milligrams of cholesterol per serving (mg per serving) of common cholesterol-containing foods are listed below. Meat and other proteins Egg -- one large whole egg has 186 mg. Veal shank -- 4 oz has 141 mg. Lean ground [place] (93% lean) -- 4 oz has 118 mg. Fat-trimmed lamb loin -- 4 oz has 106 mg. Lean ground beef (90% lean) -- 4 oz has 100 mg. Lobster -- 3.5 oz has 90 mg. Pork loin chops -- 4 oz has 86 mg. Canned salmon -- 3.5 oz has 83 mg. Fat-trimmed beef top loin -- 4 oz has 78 mg. Frankfurter -- 1 frank (3.5 oz) has 77 mg. Crab -- 3.5 oz has 71 mg. Roasted chicken without skin, white meat --  4 oz has 66 mg. Light bologna -- 2 oz has 45 mg. Deli-cut [place] -- 2 oz has 31 mg. Canned tuna -- 3.5 oz has 31 mg. Berniece Salines -- 1 oz has 29 mg. Oysters and mussels (raw) -- 3.5 oz has 25 mg. Mackerel -- 1 oz has 22 mg. Trout -- 1 oz has 20 mg. Pork sausage -- 1 link (1 oz) has 17 mg. Salmon -- 1 oz has 16 mg. Tilapia -- 1 oz has 14 mg. Dairy Soft-serve ice cream --  cup (4 oz) has 103 mg. Whole-milk yogurt -- 1 cup (8 oz) has 29 mg. Cheddar cheese -- 1 oz has 28 mg. American cheese -- 1 oz has 28 mg. Whole milk -- 1 cup (8 oz) has 23 mg. 2% milk -- 1 cup (8 oz) has 18 mg. Cream cheese -- 1 tablespoon  (Tbsp) has 15 mg. Cottage cheese --  cup (4 oz) has 14 mg. Low-fat (1%) milk -- 1 cup (8 oz) has 10 mg. Sour cream -- 1 Tbsp has 8.5 mg. Low-fat yogurt -- 1 cup (8 oz) has 8 mg. Nonfat Greek yogurt -- 1 cup (8 oz) has 7 mg. Half-and-half cream -- 1 Tbsp has 5 mg. Fats and oils Cod liver oil -- 1 tablespoon (Tbsp) has 82 mg. Butter -- 1 Tbsp has 15 mg. Lard -- 1 Tbsp has 14 mg. Bacon grease -- 1 Tbsp has 14 mg. Mayonnaise -- 1 Tbsp has 5-10 mg. Margarine -- 1 Tbsp has 3-10 mg. Exact amounts of cholesterol in these foods may vary depending on specificingredients and brands. Foods without cholesterol Most plant-based foods do not have cholesterol unless you combine them with a food that has cholesterol. Foods without cholesterol include: Grains and cereals. Vegetables. Fruits. Vegetable oils, such as olive, canola, and sunflower oil. Legumes, such as peas, beans, and lentils. Nuts and seeds. Egg whites. Summary The body needs cholesterol in small amounts, but too much cholesterol can cause damage to the arteries and heart. Most people should eat less than 200 milligrams (mg) of cholesterol a day. This information is not intended to replace advice given to you by your health care provider. Make sure you discuss any questions you have with your healthcare provider. Document Revised: 07/21/2019 Document Reviewed: 08/31/2019 Elsevier Patient Education  [place].  Ezetimibe Tablets What is this medication? EZETIMIBE (ez ET i mibe) treats high cholesterol. It works by reducing the amount of cholesterol absorbed from the food you eat. This decreases the amount of bad cholesterol (such as LDL) in your blood. Changes to diet and exerciseare often combined with this medication. This medicine may be used for other purposes; ask your health care provider orpharmacist if you have questions. COMMON BRAND NAME(S): Zetia What should I tell my care team before I take this medication? They  need to know if you have any of these conditions: Kidney disease Liver disease Muscle cramps, pain Muscle injury Thyroid disease An unusual or allergic reaction to ezetimibe, other medications, foods, dyes, or preservatives Pregnant or trying to get pregnant Breast-feeding How should I use this medication? Take this medication by mouth. Take it as directed on the prescription label at the same time every day. You can take it with or without food. If it upsets your stomach, take it with food. Keep taking it unless your care team tells youto stop. Take bile acid sequestrants at a different time of day than this medication.Take this medication 2 hours BEFORE or 4 hours AFTER  bile acid sequestrants. Talk to your care team about the use of this medication in children. While it may be prescribed for children as young as 10 for selected conditions,precautions do apply. Overdosage: If you think you have taken too much of this medicine contact apoison control center or emergency room at once. NOTE: This medicine is only for you. Do not share this medicine with others. What if I miss a dose? If you miss a dose, take it as soon as you can. If it is almost time for yournext dose, take only that dose. Do not take double or extra doses. What may interact with this medication? Do not take this medication with any of the following: Fenofibrate Gemfibrozil This medication may also interact with the following: Antacids Cyclosporine Herbal medications like red yeast rice Other medications to lower cholesterol or triglycerides This list may not describe all possible interactions. Give your health care provider a list of all the medicines, herbs, non-prescription drugs, or dietary supplements you use. Also tell them if you smoke, drink alcohol, or use illegaldrugs. Some items may interact with your medicine. What should I watch for while using this medication? Visit your care team for regular checks on your  progress. Tell your care teamif your symptoms do not start to get better or if they get worse. Your care team may tell you to stop taking this medication if you develop muscle problems. If your muscle problems do not go away after stopping thismedication, contact your care team. Do not become pregnant while taking this medication. Women should inform their care team if they wish to become pregnant or think they might be pregnant. There is potential for serious harm to an unborn child. Talk to your care teamfor more information. Do not breast-feed an infant while taking this medication. Taking this medication is only part of a total heart healthy program. Your care team may give you a special diet to follow. Avoid alcohol. Avoid smoking. Askyour care team how much you should exercise. What side effects may I notice from receiving this medication? Side effects that you should report to your doctor or health care provider assoon as possible: Allergic reactions-skin rash, itching or hives, swelling of the face, lips, tongue, or throat Side effects that usually do not require medical attention (report to yourdoctor or health care provider if they continue or are bothersome): Diarrhea Joint pain This list may not describe all possible side effects. Call your doctor for medical advice about side effects. You may report side effects to FDA at1-800-FDA-1088. Where should I keep my medication? Keep out of the reach of children and pets. Store at room temperature between 15 and 30 degrees C (59 and 86 degrees F). Protect from moisture. Get rid of any unused medication after the expirationdate. NOTE: This sheet is a summary. It may not cover all possible information. If you have questions about this medicine, talk to your doctor, pharmacist, orhealth care provider.  2022 Elsevier/Gold Standard (2020-05-06 12:29:13)

## 2020-11-25 NOTE — Progress Notes (Addendum)
Virtual Visit via Video Note  I connected wit Paul Goodwin  on 11/25/20 at  8:30 AM EDT by a video enabled telemedicine application and verified that I am speaking with the correct person using two identifiers.  Location patient: home, [place] Location provider:work or home office Persons participating in the virtual visit: patient, provider, pts wife  I discussed the limitations of evaluation and management by telemedicine and the availability of in person appointments. The patient expressed understanding and agreed to proceed.   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  Past Medical History:  Diagnosis Date   Arthritis    right wrist   Diabetes mellitus without complication (HCC)    History of allergic rhinitis    History of gout    Sleep apnea    pt doesn't wear CPAP any longer   Wears glasses     Past Surgical History:  Procedure Laterality Date   FRACTURE SURGERY     leg, feet, (hardware in left leg); 6 operations total per pt report   HARDWARE REMOVAL     left leg   HERNIA REPAIR     right leg     has rod placed and then removed   right shoulder surgery     had to reattach muscle and tendons to shoulder     Current Outpatient Medications:    Apple Cider Vinegar 500 MG TABS, , Disp: , Rfl:    B Complex-C-Folic Acid (SUPER B COMPLEX/FA/VIT C) TABS, , Disp: , Rfl:    Coenzyme Q10 (COQ10 PO), Take by mouth daily., Disp: , Rfl:    cyanocobalamin 2000 MCG tablet, Take 2,000 mcg by mouth daily., Disp: , Rfl:    ezetimibe (ZETIA) 10 MG tablet, Take 1 tablet (10 mg total) by mouth daily., Disp: 90 tablet, Rfl: 3   Garlic 123XX123 MG CAPS, , Disp: , Rfl:    glucose blood (ONE TOUCH TEST STRIPS) test strip, Use as instructed to test blood  sugar once daily E11.9 RELION PRIME STRIPS, Disp: 100 each, Rfl: 12   Magnesium 250 MG TABS, , Disp: , Rfl:    Multiple Vitamin (MULTIVITAMINS PO), Take by mouth daily., Disp: , Rfl:    NON FORMULARY, daily. Testosterone booster, Disp: , Rfl:    Omega 3 1000 MG CAPS, , Disp: , Rfl:    ReliOn Ultra Thin Lancets MISC, 1 Device by Does not apply route daily. Lancets approved with machine has relion machine, Disp: 100 each, Rfl: 3   Testosterone 100 MG PLLT, , Disp: , Rfl:    TURMERIC PO, Take by mouth., Disp: , Rfl:    UNABLE TO FIND, Med Name: extra virgin olive oil 1 tablespoon, Disp: , Rfl:   EXAM:  VITALS per patient if applicable:  GENERAL: alert, oriented, appears well and in no acute distress  HEENT: atraumatic, conjunttiva clear, no obvious abnormalities on inspection of external nose and ears  NECK: normal movements of the head and neck  LUNGS: on inspection no signs of respiratory distress, breathing rate appears normal, no obvious gross SOB, gasping or wheezing  CV: no obvious cyanosis  MS: moves all visible extremities without noticeable abnormality  PSYCH/NEURO: pleasant and cooperative, no obvious depression or  anxiety, speech and thought processing grossly intact  ASSESSMENT AND PLAN:  Discussed the following assessment and plan:  Hyperlipidemia, unspecified hyperlipidemia type - Plan: ezetimibe (ZETIA) 10 MG tablet, Comprehensive metabolic panel, CBC w/Diff  Type 2 diabetes mellitus without complication, with long-term current use of insulin (HCC) - Plan: Hemoglobin A1c, Lipid panel, Comprehensive metabolic panel, CBC w/Diff  Gout, unspecified cause, unspecified chronicity, unspecified site - Plan: Comprehensive metabolic panel, CBC w/Diff, Uric acid  HM Declines vaccine (flu, pna 23, shingles, hep B vaccines, covid shot) Tdap due PT WANTS TO THINK ABOUT    sch fasting labs asap Colonoscopy had Dr. Hilarie Fredrickson 05/07/14 tubular adenoma needs f/u in 5 years  -as of  06/09/20 pt will let me know when ready for this closed referral 11/25/19 referred again 2021 did not have done cc'ed Dr. Hilarie Fredrickson 11/25/20  Thank you for the message.  Based on new guidelines surveillance colonoscopy recommended January 2023  We will reach out to the patient at that time     Dermatology consider in future Aks to changes mild and early rec sunscreen  Never smoker wife is exposed 2nd hand Declines STD check  PSA normal 05/2020    rec healthy diet and exercise   -we discussed possible serious and likely etiologies, options for evaluation and workup, limitations of telemedicine visit vs in person visit, treatment, treatment risks and precautions. Pt prefers to treat via telemedicine empirically rather than in person at this moment.    I discussed the assessment and treatment plan with the patient. The patient was provided an opportunity to ask questions and all were answered. The patient agreed with the plan and demonstrated an understanding of the instructions.    Time spent 20 min Delorise Jackson, MD

## 2020-11-28 ENCOUNTER — Telehealth: Payer: Self-pay | Admitting: *Deleted

## 2020-11-28 NOTE — Telephone Encounter (Signed)
-----   Message from Jerene Bears, MD sent at 11/27/2020  2:45 PM EDT ----- Thank you for the message. Based on new guidelines surveillance colonoscopy recommended January 2023 We will reach out to the patient at that time Thanks Willis Modena,  Recall colonoscopy January 2023 for history of polyps JMP ----- Message ----- From: McLean-Scocuzza, Nino Glow, MD Sent: 11/25/2020   9:10 AM EDT To: Jerene Bears, MD  Pt needs colonoscopy can you reach out your office thanks

## 2020-11-28 NOTE — Telephone Encounter (Signed)
Recall colonoscopy has been placed for 04/2021.

## 2020-11-30 ENCOUNTER — Encounter: Payer: Self-pay | Admitting: Internal Medicine

## 2020-11-30 DIAGNOSIS — E785 Hyperlipidemia, unspecified: Secondary | ICD-10-CM

## 2020-12-01 NOTE — Telephone Encounter (Signed)
Please advise  on symptoms with cholesterol medication

## 2020-12-04 NOTE — Telephone Encounter (Signed)
Please contact the patient and make sure she got the MyChart response.  She needs to stop the Zetia.  Given her dark urine she needs to have her liver function checked to make sure there has not been an injury to her liver from the medication.  Order placed.  Please get her scheduled for this week.  Thanks.

## 2020-12-05 NOTE — Telephone Encounter (Signed)
Patient informed and verbalized understanding.  States he stopped the Zetia and he feels better, urine is back to clear. 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 Patient states he will keep his fasting lab appointment for 12/12/20 as he would be unable to come in sooner.

## 2020-12-05 NOTE — Telephone Encounter (Signed)
Noted  

## 2020-12-12 ENCOUNTER — Other Ambulatory Visit (INDEPENDENT_AMBULATORY_CARE_PROVIDER_SITE_OTHER): Payer: BC Managed Care – PPO

## 2020-12-12 ENCOUNTER — Other Ambulatory Visit: Payer: Self-pay

## 2020-12-12 DIAGNOSIS — E785 Hyperlipidemia, unspecified: Secondary | ICD-10-CM

## 2020-12-12 DIAGNOSIS — M109 Gout, unspecified: Secondary | ICD-10-CM | POA: Diagnosis not present

## 2020-12-12 DIAGNOSIS — Z794 Long term (current) use of insulin: Secondary | ICD-10-CM

## 2020-12-12 DIAGNOSIS — E119 Type 2 diabetes mellitus without complications: Secondary | ICD-10-CM | POA: Diagnosis not present

## 2020-12-12 LAB — CBC WITH DIFFERENTIAL/PLATELET
Basophils Absolute: 0.1 10*3/uL (ref 0.0–0.1)
Basophils Relative: 1.5 % (ref 0.0–3.0)
Eosinophils Absolute: 0.3 10*3/uL (ref 0.0–0.7)
Eosinophils Relative: 6.9 % — ABNORMAL HIGH (ref 0.0–5.0)
HCT: 44.7 % (ref 39.0–52.0)
Hemoglobin: 15 g/dL (ref 13.0–17.0)
Lymphocytes Relative: 26.3 % (ref 12.0–46.0)
Lymphs Abs: 1.1 10*3/uL (ref 0.7–4.0)
MCHC: 33.4 g/dL (ref 30.0–36.0)
MCV: 93.8 fl (ref 78.0–100.0)
Monocytes Absolute: 0.4 10*3/uL (ref 0.1–1.0)
Monocytes Relative: 8.3 % (ref 3.0–12.0)
Neutro Abs: 2.5 10*3/uL (ref 1.4–7.7)
Neutrophils Relative %: 57 % (ref 43.0–77.0)
Platelets: 256 10*3/uL (ref 150.0–400.0)
RBC: 4.77 Mil/uL (ref 4.22–5.81)
RDW: 13 % (ref 11.5–15.5)
WBC: 4.3 10*3/uL (ref 4.0–10.5)

## 2020-12-12 LAB — HEPATIC FUNCTION PANEL
ALT: 15 U/L (ref 0–53)
AST: 19 U/L (ref 0–37)
Albumin: 4.8 g/dL (ref 3.5–5.2)
Alkaline Phosphatase: 72 U/L (ref 39–117)
Bilirubin, Direct: 0.2 mg/dL (ref 0.0–0.3)
Total Bilirubin: 1.1 mg/dL (ref 0.2–1.2)
Total Protein: 7.1 g/dL (ref 6.0–8.3)

## 2020-12-12 LAB — COMPREHENSIVE METABOLIC PANEL
ALT: 15 U/L (ref 0–53)
AST: 19 U/L (ref 0–37)
Albumin: 4.8 g/dL (ref 3.5–5.2)
Alkaline Phosphatase: 72 U/L (ref 39–117)
BUN: 21 mg/dL (ref 6–23)
CO2: 26 mEq/L (ref 19–32)
Calcium: 9.7 mg/dL (ref 8.4–10.5)
Chloride: 104 mEq/L (ref 96–112)
Creatinine, Ser: 1.14 mg/dL (ref 0.40–1.50)
GFR: 70.89 mL/min (ref >60.00)
Glucose, Bld: 97 mg/dL (ref 70–99)
Potassium: 4.3 mEq/L (ref 3.5–5.1)
Sodium: 140 mEq/L (ref 135–145)
Total Bilirubin: 1.1 mg/dL (ref 0.2–1.2)
Total Protein: 7.1 g/dL (ref 6.0–8.3)

## 2020-12-12 LAB — URIC ACID: Uric Acid, Serum: 8.1 mg/dL — ABNORMAL HIGH (ref 4.0–7.8)

## 2020-12-12 LAB — LIPID PANEL
Cholesterol: 226 mg/dL — ABNORMAL HIGH (ref 0–200)
HDL: 66.9 mg/dL (ref >39.00)
LDL Cholesterol: 140 mg/dL — ABNORMAL HIGH (ref 0–99)
NonHDL: 158.9
Total CHOL/HDL Ratio: 3
Triglycerides: 97 mg/dL (ref 0.0–149.0)
VLDL: 19.4 mg/dL (ref 0.0–40.0)

## 2020-12-12 LAB — HEMOGLOBIN A1C: Hgb A1c MFr Bld: 6 % (ref 4.6–6.5)

## 2020-12-19 ENCOUNTER — Telehealth: Payer: Self-pay | Admitting: Internal Medicine

## 2020-12-19 DIAGNOSIS — Z0000 Encounter for general adult medical examination without abnormal findings: Secondary | ICD-10-CM

## 2020-12-19 NOTE — Telephone Encounter (Signed)
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

## 2020-12-20 NOTE — Telephone Encounter (Signed)
Called and spoke with Paul Goodwin. He states that he was given a 3 month time frame to complete his sleep study and go back for his DOT physical. He was told that he needed to have a sleep study done to complete his physical. Pt was scheduled with Paul Goodwin on 01/16/21 for an evaluation and a referral.

## 2020-12-21 NOTE — Telephone Encounter (Signed)
Just FYI I spoke with patient & he does not have CPAP. He only has a hx bc he was diagnosed 15+ years ago & turned out he only had sleep apnea due to nasal polyps. Pt had nasal polyps removed & has not had issues since. He stated that DOT form was changed this year & asks if you have ever HAD sleep apnea as opposed to you have sleep apnea. Pt will wait on pulmonology referral to be revaluated by them.

## 2020-12-21 NOTE — Telephone Encounter (Signed)
Call patient Sleep studies are done through pulmonology.  Does he has a history of sleep apnea? Does he currently have a CPAP machine? Have placed referral to pulmonology. Let us know if you dont hear back within a week in regards to an appointment being scheduled.

## 2021-01-16 ENCOUNTER — Encounter: Payer: Self-pay | Admitting: Primary Care

## 2021-01-16 ENCOUNTER — Other Ambulatory Visit: Payer: Self-pay

## 2021-01-16 ENCOUNTER — Ambulatory Visit (INDEPENDENT_AMBULATORY_CARE_PROVIDER_SITE_OTHER): Payer: BC Managed Care – PPO | Admitting: Primary Care

## 2021-01-16 ENCOUNTER — Ambulatory Visit: Payer: BC Managed Care – PPO | Admitting: Family

## 2021-01-16 DIAGNOSIS — G4730 Sleep apnea, unspecified: Secondary | ICD-10-CM

## 2021-01-16 NOTE — Assessment & Plan Note (Addendum)
-   Dx sleep apnea d/t nasal polyps  >14 years ago.  He underwent surgery at that time to have his nasal polyps removed. His original sleep [place] another state and results are not available. He no longer has sleep difficulties. He does have some nasal congestion to his left nostril, possible polyp on exam. Denies snoring, restless sleep or choking. He gets on average 8 hours of sleep in 24 hours period. 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 He has no daytime somolence. Epworth score 3. Ordered HST to re-assess for underlying OSA. Advised against driving if experiencing excessive daytime sleepiness. Follow-up televisit in 4 weeks to review results and treatment plan if needed.

## 2021-01-16 NOTE — Patient Instructions (Addendum)
Recommendations: - Do not drive if experiencing excessive daytime sleepiness - Get 8 hours of sleep every 24 hours  Orders: - Home sleep study re: hx sleep apnea (priority)  Follow-up: -Virtual televisit in 4-6 weeks to review sleep study results   Sleep Apnea Sleep apnea affects breathing during sleep. It causes breathing to stop for 10 seconds or more, or to become shallow. People with sleep apnea usually snore loudly. It can also increase the risk of: Heart attack. Stroke. Being very overweight (obese). Diabetes. Heart failure. Irregular heartbeat. High blood pressure. The goal of treatment is to help you breathe normally again. What are the causes? The most common cause of this condition is a collapsed or blocked airway. There are three kinds of sleep apnea: Obstructive sleep apnea. This is caused by a blocked or collapsed airway. Central sleep apnea. This happens when the brain does not send the right signals to the muscles that control breathing. Mixed sleep apnea. This is a combination of obstructive and central sleep apnea. What increases the risk? Being overweight. Smoking. Having a small airway. Being older. Being male. Drinking alcohol. Taking medicines to calm yourself (sedatives or tranquilizers). Having family members with the condition. Having a tongue or tonsils that are larger than normal. What are the signs or symptoms? Trouble staying asleep. Loud snoring. Headaches in the morning. Waking up gasping. Dry mouth or sore throat in the morning. Being sleepy or tired during the day. If you are sleepy or tired during the day, you may also: Not be able to focus your mind (concentrate). Forget things. Get angry a lot and have mood swings. Feel sad (depressed). Have changes in your personality. Have less interest in sex, if you are male. Be unable to have an erection, if you are male. How is this treated?  Sleeping on your side. Using a medicine to  get rid of mucus in your nose (decongestant). Avoiding the use of alcohol, medicines to help you relax, or certain pain medicines (narcotics). Losing weight, if needed. Changing your diet. Quitting smoking. Using a machine to open your airway while you sleep, such as: An oral appliance. This is a mouthpiece that shifts your lower jaw forward. A CPAP device. This device blows air through a mask when you breathe out (exhale). An EPAP device. This has valves that you put in each nostril. A BPAP device. This device blows air through a mask when you breathe in (inhale) and breathe out. Having surgery if other treatments do not work. Follow these instructions at home: Lifestyle Make changes that your doctor recommends. Eat a healthy diet. Lose weight if needed. Avoid alcohol, medicines to help you relax, and some pain medicines. Do not smoke or use any products that contain nicotine or tobacco. If you need help quitting, ask your doctor. General instructions Take over-the-counter and prescription medicines only as told by your doctor. If you were given a machine to use while you sleep, use it only as told by your doctor. If you are having surgery, make sure to tell your doctor you have sleep apnea. You may need to bring your device with you. Keep all follow-up visits. Contact a doctor if: The machine that you were given to use during sleep bothers you or does not seem to be working. You do not get better. You get worse. Get help right away if: Your chest hurts. You have trouble breathing in enough air. You have an uncomfortable feeling in your back, arms, or stomach. You have  trouble talking. One side of your body feels weak. A part of your face is hanging down. These symptoms may be an emergency. Get help right away. Call your local emergency services (911 in the [place] Do not wait to see if the symptoms will go away. Do not drive yourself to the hospital. Summary This condition  affects breathing during sleep. The most common cause is a collapsed or blocked airway. The goal of treatment is to help you breathe normally while you sleep. This information is not intended to replace advice given to you by your health care provider. Make sure you discuss any questions you have with your health care provider. Document Revised: 03/18/2020 Document Reviewed: 03/18/2020 Elsevier Patient Education  2022 [employer].

## 2021-01-16 NOTE — Progress Notes (Signed)
@Patient  ID: Paul Goodwin, male    DOB: 07/14/1960, 60 y.o.   MRN: [id number]  Chief Complaint  Patient presents with   Consult    Referring provider: Burnard Hawthorne, FNP  HPI: 60 year old male, never smoked.  Past medical history significant for sleep apnea, allergic rhinitis, bradycardia, hyperlipidemia.    01/16/2021 - Interim hx  Patient presents today for sleep consult.  He was diagnosed with sleep apnea greater than 15 years ago due to nasal polyps. He was never on CPAP therapy. 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 Questionnaire form was changed from do you have sleep apnea to have you ever had sleep apnea?  No sleep issues or daytime fatigue. 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 On average he gets 8 hours of sleep. He is required to take 10 hours break. 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 He has some nasal congestion, mainly on left side. Denies narcolepsy, cataplexy, sleep walking.   Sleep questionnaire: Symptoms- None Prior sleep study- >14-15 years ago, results not available (unsure state testing weas done) Bedtime- Varies Time to fall asleep- 30-60 mins Nocturnal awakenings- None Out of bed in morning- Varies  Epworth- 3   No Known Allergies  Immunization History  Administered Date(s) Administered   Td 08/31/2009    Past Medical History:  Diagnosis Date   Arthritis    right wrist   Diabetes mellitus without complication (HCC)    History of allergic rhinitis    History of gout    Sleep apnea    pt doesn't wear CPAP any longer   Wears glasses     Tobacco History: Social History   Tobacco Use  Smoking Status Never  Smokeless Tobacco Never   Counseling given: Not Answered   Outpatient Medications Prior to Visit  Medication Sig Dispense Refill   Apple Cider Vinegar 500 MG TABS      B Complex-C-Folic Acid (SUPER B COMPLEX/FA/VIT C) TABS      Coenzyme Q10 (COQ10 PO) Take by mouth daily.      cyanocobalamin 2000 MCG tablet Take 2,000 mcg by mouth daily.     Garlic 2706 MG CAPS      glucose blood (ONE TOUCH TEST STRIPS) test strip Use as instructed to test blood sugar once daily E11.9 RELION PRIME STRIPS 100 each 12   Magnesium 250 MG TABS      Multiple Vitamin (MULTIVITAMINS PO) Take by mouth daily.     NON FORMULARY daily. Testosterone booster     Omega 3 1000 MG CAPS      ReliOn Ultra Thin Lancets MISC 1 Device by Does not apply route daily. Lancets approved with machine has relion machine 100 each 3   Testosterone 100 MG PLLT      TURMERIC PO Take by mouth.     UNABLE TO FIND Med Name: extra virgin olive oil 1 tablespoon     ezetimibe (ZETIA) 10 MG tablet Take 1 tablet (10 mg total) by mouth daily. 90 tablet 3   No facility-administered medications prior to visit.    Review of Systems  Review of Systems  Constitutional: Negative.   HENT:  Positive for congestion.   Respiratory: Negative.    Psychiatric/Behavioral: Negative.  Negative for sleep disturbance.     Physical Exam  BP 120/72 (BP Location: Left Arm, Patient Position: Sitting, Cuff Size: Normal)   Pulse 60   Temp 97.8 F (36.6 C)  Ht 5\' 8"  (1.727 m)   Wt 187 lb 6.4 oz (85 kg)   SpO2 98%   BMI 28.49 kg/m  Physical Exam Constitutional:      Appearance: Normal appearance.  HENT:     Nose:     Comments: Possible nasal polyps, L> R    Mouth/Throat:     Mouth: Mucous membranes are moist.     Pharynx: Oropharynx is clear.  Cardiovascular:     Rate and Rhythm: Normal rate and regular rhythm.  Pulmonary:     Effort: Pulmonary effort is normal.     Breath sounds: Normal breath sounds.  Musculoskeletal:        General: Normal range of motion.  Skin:    General: Skin is warm and dry.  Neurological:     General: No focal deficit present.     Mental Status: He is alert and oriented to person, place, and time. Mental status is at baseline.  Psychiatric:        Mood and Affect: Mood normal.         Behavior: Behavior normal.        Thought Content: Thought content normal.        Judgment: Judgment normal.     Lab Results:  CBC    Component Value Date/Time   WBC 4.3 12/12/2020 0831   RBC 4.77 12/12/2020 0831   HGB 15.0 12/12/2020 0831   HCT 44.7 12/12/2020 0831   PLT 256.0 12/12/2020 0831   MCV 93.8 12/12/2020 0831   MCHC 33.4 12/12/2020 0831   RDW 13.0 12/12/2020 0831   LYMPHSABS 1.1 12/12/2020 0831   MONOABS 0.4 12/12/2020 0831   EOSABS 0.3 12/12/2020 0831   BASOSABS 0.1 12/12/2020 0831    BMET    Component Value Date/Time   NA 140 12/12/2020 0831   NA 141 02/15/2020 0820   K 4.3 12/12/2020 0831   CL 104 12/12/2020 0831   CO2 26 12/12/2020 0831   GLUCOSE 97 12/12/2020 0831   BUN 21 12/12/2020 0831   BUN 15 02/15/2020 0820   CREATININE 1.14 12/12/2020 0831   CALCIUM 9.7 12/12/2020 0831   GFRNONAA 76 02/15/2020 0820   GFRAA 88 02/15/2020 0820    BNP No results found for: BNP  ProBNP No results found for: PROBNP  Imaging: No results found.   Assessment & Plan:   Sleep apnea - Dx sleep apnea d/t nasal polyps  >14 years ago.  He underwent surgery at that time to have his nasal polyps removed. His [place] another state and results are not available. He no longer has sleep difficulties. He does have some nasal congestion to his left nostril, possible polyp on exam. Denies snoring, restless sleep or choking. He gets on average 8 hours of sleep in 24 hours period. 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 He has no daytime somolence. Epworth score 3. Ordered HST to re-assess for underlying OSA. Advised against driving if experiencing excessive daytime sleepiness. Follow-up televisit in 4 weeks to review results and treatment plan if needed.    Martyn Ehrich, NP 01/16/2021

## 2021-01-16 NOTE — Progress Notes (Signed)
Reviewed and agree with assessment/plan.   Chesley Mires, MD [facility] Pulmonary/Critical Care 01/16/2021, 1:05 PM Pager:  [phone]

## 2021-01-23 ENCOUNTER — Telehealth: Payer: Self-pay | Admitting: Primary Care

## 2021-01-23 DIAGNOSIS — G4730 Sleep apnea, unspecified: Secondary | ICD-10-CM

## 2021-01-23 NOTE — Telephone Encounter (Signed)
There were no orders placed for a sleep study of any kind for this patient

## 2021-01-23 NOTE — Telephone Encounter (Signed)
HST has been ordered

## 2021-01-27 NOTE — Telephone Encounter (Signed)
I have spoke with Paul Goodwin just to let him know that we are scheduling out 4-6 weeks but I can fax his home sleep test order over to [facility] and they should be able to getting him in a lot sooner.  He stated which ever was the fastest was fine with him.  I am faxing over the form and records to [facility] today

## 2021-02-03 ENCOUNTER — Telehealth: Payer: Self-pay | Admitting: Primary Care

## 2021-02-03 NOTE — Telephone Encounter (Signed)
Spoke to patient, who wanted to make Beth aware that he will be having H[facility] at [facility] on 10/26. Nothing further needed.  Will route to beth as an [place].

## 2021-02-16 ENCOUNTER — Ambulatory Visit: Payer: BC Managed Care – PPO | Attending: Otolaryngology

## 2021-02-16 DIAGNOSIS — G4733 Obstructive sleep apnea (adult) (pediatric): Secondary | ICD-10-CM | POA: Insufficient documentation

## 2021-02-22 ENCOUNTER — Telehealth: Payer: Self-pay | Admitting: Primary Care

## 2021-02-22 NOTE — Telephone Encounter (Signed)
Called and spoke with pt who stated he had sleep study 10/26. Stated to him that it usually takes a couple of weeks for [place] to get the results and stated to him if he had not heard from [place] about the results in a week or two to call us back and he verbalized understanding. Nothing further needed.

## 2021-03-01 ENCOUNTER — Telehealth: Payer: Self-pay | Admitting: Primary Care

## 2021-03-01 DIAGNOSIS — G4730 Sleep apnea, unspecified: Secondary | ICD-10-CM

## 2021-03-01 NOTE — Telephone Encounter (Signed)
I have spoke with Stacie with [facility] she states that they have the data and it looks like they are waiting on Dr. Richardson Landry to read the study. Lanetta Inch will talk to Sharyn Lull their at [facility] to make sure this is correct

## 2021-03-01 NOTE — Telephone Encounter (Signed)
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 Please advise [phone] Pt can be reached at [phone] to give permission to speak to [place]

## 2021-03-01 NOTE — Telephone Encounter (Signed)
I will forward to Paul Goodwin since this was a [facility] patient

## 2021-03-01 NOTE — Telephone Encounter (Signed)
Hey Team do we have an estimate of when we can get this patient scheduled for the HST? Employer needs to know. Wanted to check with you guys before I contacted them. Thanks

## 2021-03-02 NOTE — Telephone Encounter (Signed)
HST has been faxed to [facility] for review.  Patient is requesting that we fax HST to [employer] at [phone]. This is needed for DOT and is due prior to 03/05/2021. Will fax once reviewed by provider.   Tammy, can you review sleep study. Eustaquio Maize is unavailable until 03/06/2021.

## 2021-03-02 NOTE — Telephone Encounter (Signed)
Received HST results on 11/10. Sending this as a confirmation that we received the HST results, will fax to [place].

## 2021-03-02 NOTE — Telephone Encounter (Signed)
Can fax results but will need to be reviewed by Volanda Napoleon .

## 2021-03-03 ENCOUNTER — Other Ambulatory Visit: Payer: Self-pay

## 2021-03-03 NOTE — Telephone Encounter (Signed)
CY please advise if you would be willing to read a sleep study in absence of Beth or have patient wait until Monday. Thanks :)

## 2021-03-03 NOTE — Telephone Encounter (Signed)
Pt called requesting results of his sleep study. 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

## 2021-03-03 NOTE — Telephone Encounter (Signed)
Called and spoke to Patient, Per Alwyn Pea the [facility], states she is going to go head and fax the HST over to his job. Nothing further needed.

## 2021-03-03 NOTE — Telephone Encounter (Signed)
The slep study has been read through the [place] sleep lab. It shows severe obstructive sleep apnea with an AHI of 37.9 apneas/ hour, drops in oxygen level to a low of 77%. I will put in our system to be scanned into Epic, but treatment recommendations should come from [place], NP and the [place] office.

## 2021-03-03 NOTE — Telephone Encounter (Signed)
HST has been placed on your desk. Thanks CY :)

## 2021-03-03 NOTE — Telephone Encounter (Signed)
Created in error

## 2021-03-03 NOTE — Telephone Encounter (Signed)
It doesn't look as if that HST has been read yet. Only the sleep docs read studies. I can read the study if [facility] can get me the folder.

## 2021-03-03 NOTE — Telephone Encounter (Signed)
Message routed to [facility], NP to be reviewed for recommendations.

## 2021-03-06 NOTE — Telephone Encounter (Signed)
He should have in-lab CPAP titration to determine pressure setting and if he needs oxygen. And also place order for auto CPAP 5-20cm h20 (high priority) if he is open to starting therapy

## 2021-03-06 NOTE — Telephone Encounter (Signed)
Called and spoke with patient, advised patient of recommendations per Geraldo Pitter NP.  He lives in [place] and will need the in lab CPAP titration set up there.  Advised that we will get it set up as soon as possible.  He verbalized understanding.    Order placed for CPAP titration and CPAP machine high priority.  Nothing further needed.

## 2021-03-08 NOTE — Telephone Encounter (Signed)
Please see 03/01/2021 phone note.  Patient is aware of results.

## 2021-04-04 ENCOUNTER — Ambulatory Visit (INDEPENDENT_AMBULATORY_CARE_PROVIDER_SITE_OTHER): Payer: BC Managed Care – PPO | Admitting: Adult Health

## 2021-04-04 ENCOUNTER — Encounter: Payer: Self-pay | Admitting: Adult Health

## 2021-04-04 ENCOUNTER — Telehealth: Payer: Self-pay

## 2021-04-04 ENCOUNTER — Other Ambulatory Visit: Payer: Self-pay

## 2021-04-04 DIAGNOSIS — G4733 Obstructive sleep apnea (adult) (pediatric): Secondary | ICD-10-CM | POA: Diagnosis not present

## 2021-04-04 NOTE — Telephone Encounter (Signed)
Called and spoke to [place] at [facility]. Per patients request, Patient has an appointment to be set up on CPAP on 04/10/2021 at 9:00 with [facility]. Nothing further needed.

## 2021-04-04 NOTE — Progress Notes (Signed)
Reviewed and agree with assessment/plan.   Chesley Mires, MD [facility] Pulmonary/Critical Care 04/04/2021, 10:58 AM Pager:  [phone]

## 2021-04-04 NOTE — Patient Instructions (Addendum)
Go for CPAP titration study  Begin CPAP when available  Wear CPAP all night long for at least 4hrs or more each night .  Dreamwear nasal mask may be a good mask for you  Follow up in 6-8 weeks and As needed

## 2021-04-04 NOTE — Progress Notes (Signed)
@Patient  ID: Paul Goodwin, male    DOB: 19-Jul-1960, 60 y.o.   MRN: [id number]  Chief Complaint  Patient presents with   Follow-up    Referring provider: McLean-Scocuzza, Olivia Mackie *  HPI: 60 year old male seen for sleep consult January 16, 2021.  Hx of Nasal polyps   TEST/EVENTS :  AHI of 37.9 apneas/ hour, drops in oxygen level to a low of 77%.  04/04/2021 Follow up ; OSA  Patient returns for a 39-month follow-up.  Patient was seen last visit for a sleep consult.  Patient had previously been diagnosed with sleep apnea greater than 15 years ago when he had nasal polyps.  He had his nasal polyps removed and felt sleep apnea went away with no known sleep issues.  Patient has a [place] license and is being required to have a sleep study for DOT physical.  Patient was set up for a home sleep study that showed severe obstructive sleep apnea with AHI at 37.9/hour and SPO2 low at 77%.  Patient was recommended to begin CPAP and was also set up for an in lab CPAP titration study. He has this set up on 04/06/21, He is picking up his CPAP on 04/10/21.  Patient education on OSA and CPAP .  Patient denies daytime sleepiness. Feels he sleeps fine.   No Known Allergies  Immunization History  Administered Date(s) Administered   Td 08/31/2009    Past Medical History:  Diagnosis Date   Arthritis    right wrist   Diabetes mellitus without complication ([place])    History of allergic rhinitis    History of gout    Sleep apnea    pt doesn't wear CPAP any longer   Wears glasses     Tobacco History: Social History   Tobacco Use  Smoking Status Never  Smokeless Tobacco Never   Counseling given: Not Answered   Outpatient Medications Prior to Visit  Medication Sig Dispense Refill   Apple Cider Vinegar 500 MG TABS      B Complex-C-Folic Acid (SUPER B COMPLEX/FA/VIT C) TABS      Coenzyme Q10 (COQ10 PO) Take by mouth daily.     cyanocobalamin 2000 MCG tablet Take 2,000 mcg by mouth daily.      Garlic 6606 MG CAPS      glucose blood (ONE TOUCH TEST STRIPS) test strip Use as instructed to test blood sugar once daily E11.9 RELION PRIME STRIPS 100 each 12   Magnesium 250 MG TABS      Multiple Vitamin (MULTIVITAMINS PO) Take by mouth daily.     NON FORMULARY daily. Testosterone booster     Omega 3 1000 MG CAPS      ReliOn Ultra Thin Lancets MISC 1 Device by Does not apply route daily. Lancets approved with machine has relion machine 100 each 3   Testosterone 100 MG PLLT      TURMERIC PO Take by mouth.     UNABLE TO FIND Med Name: extra virgin olive oil 1 tablespoon     ezetimibe (ZETIA) 10 MG tablet Take 1 tablet (10 mg total) by mouth daily. 90 tablet 3   No facility-administered medications prior to visit.     Review of Systems:   Constitutional:   No  weight loss, night sweats,  Fevers, chills, fatigue, or  lassitude.  HEENT:   No headaches,  Difficulty swallowing,  Tooth/dental problems, or  Sore throat,  No sneezing, itching, ear ache, nasal congestion, post nasal drip,   CV:  No chest pain,  Orthopnea, PND, swelling in lower extremities, anasarca, dizziness, palpitations, syncope.   GI  No heartburn, indigestion, abdominal pain, nausea, vomiting, diarrhea, change in bowel habits, loss of appetite, bloody stools.   Resp: No shortness of breath with exertion or at rest.  No excess mucus, no productive cough,  No non-productive cough,  No coughing up of blood.  No change in color of mucus.  No wheezing.  No chest wall deformity  Skin: no rash or lesions.  GU: no dysuria, change in color of urine, no urgency or frequency.  No flank pain, no hematuria   MS:  No joint pain or swelling.  No decreased range of motion.  No back pain.    Physical Exam  BP 110/88 (BP Location: Left Arm, Patient Position: Sitting, Cuff Size: Normal)    Pulse (!) 55    Temp 98 F (36.7 C) (Oral)    Ht 5\' 8"  (1.727 m)    Wt 189 lb (85.7 kg)    SpO2 99%    BMI 28.74 kg/m   GEN:  A/Ox3; pleasant , NAD, well nourished    HEENT:  [place]/AT,  NOSE-clear, THROAT-clear, no lesions, no postnasal drip or exudate noted.   NECK:  Supple w/ fair ROM; no JVD; normal carotid impulses w/o bruits; no thyromegaly or nodules palpated; no lymphadenopathy.    RESP  Clear  P & A; w/o, wheezes/ rales/ or rhonchi. no accessory muscle use, no dullness to percussion  CARD:  RRR, no m/r/g, no peripheral edema, pulses intact, no cyanosis or clubbing.  GI:   Soft & nt; nml bowel sounds; no organomegaly or masses detected.   Musco: Warm bil, no deformities or joint swelling noted.   Neuro: alert, no focal deficits noted.    Skin: Warm, no lesions or rashes    Lab Results:   BMET  No results found for: BNP  ProBNP No results found for: PROBNP  Imaging: SLEEP STUDY DOCUMENTS  Result Date: 03/06/2021 Ordered by an unspecified provider.     No flowsheet data found.  No results found for: NITRICOXIDE      Assessment & Plan:   Sleep apnea Severe obstructive sleep apnea.  Patient is to begin CPAP at bedtime.  CPAP titration study is pending.  Patient is to follow back up in the office in 6 weeks.  Patient education given on sleep apnea and on CPAP usage and care.  Plan  Patient Instructions  Go for CPAP titration study  Begin CPAP when available  Wear CPAP all night long for at least 4hrs or more each night .  Dreamwear nasal mask may be a good mask for you  Follow up in 6-8 weeks and As needed          Rexene Edison, NP 04/04/2021

## 2021-04-04 NOTE — Assessment & Plan Note (Signed)
Severe obstructive sleep apnea.  Patient is to begin CPAP at bedtime.  CPAP titration study is pending.  Patient is to follow back up in the office in 6 weeks.  Patient education given on sleep apnea and on CPAP usage and care.  Plan  Patient Instructions  Go for CPAP titration study  Begin CPAP when available  Wear CPAP all night long for at least 4hrs or more each night .  Dreamwear nasal mask may be a good mask for you  Follow up in 6-8 weeks and As needed

## 2021-04-05 ENCOUNTER — Other Ambulatory Visit
Admission: RE | Admit: 2021-04-05 | Discharge: 2021-04-05 | Disposition: A | Discharge status: Home / Self Care | Payer: BC Managed Care – PPO | Source: Ambulatory Visit | Attending: Primary Care | Admitting: Primary Care

## 2021-04-05 DIAGNOSIS — Z01812 Encounter for preprocedural laboratory examination: Secondary | ICD-10-CM | POA: Insufficient documentation

## 2021-04-05 DIAGNOSIS — Z20822 Contact with and (suspected) exposure to covid-19: Secondary | ICD-10-CM | POA: Diagnosis not present

## 2021-04-05 LAB — SARS CORONAVIRUS 2 (TAT 6-24 HRS): SARS Coronavirus 2: NEGATIVE

## 2021-04-06 ENCOUNTER — Ambulatory Visit: Payer: BC Managed Care – PPO | Attending: Pulmonary Disease

## 2021-04-06 DIAGNOSIS — G4733 Obstructive sleep apnea (adult) (pediatric): Secondary | ICD-10-CM | POA: Diagnosis not present

## 2021-04-07 ENCOUNTER — Other Ambulatory Visit: Payer: Self-pay

## 2021-04-10 DIAGNOSIS — G4733 Obstructive sleep apnea (adult) (pediatric): Secondary | ICD-10-CM | POA: Diagnosis not present

## 2021-04-11 ENCOUNTER — Telehealth (INDEPENDENT_AMBULATORY_CARE_PROVIDER_SITE_OTHER): Payer: BC Managed Care – PPO | Admitting: Pulmonary Disease

## 2021-04-11 DIAGNOSIS — G4733 Obstructive sleep apnea (adult) (pediatric): Secondary | ICD-10-CM | POA: Diagnosis not present

## 2021-04-11 NOTE — Telephone Encounter (Signed)
CPAP 7 cm , small fullface mask

## 2021-05-01 NOTE — Telephone Encounter (Signed)
CPAP titration on 04/06/21 showed optimal control on CPAP 7cm H2O with small full face mask.   Will discuss in detail at follow up ov later this month with CPAP download.  Continue on current settings for now .

## 2021-05-10 NOTE — Telephone Encounter (Signed)
ATC patient x1.  LVM to return call.  Patient has f/u with TP on 05/23/21.  Download printed out for TP to review.

## 2021-05-10 NOTE — Telephone Encounter (Signed)
Patient returning call.

## 2021-05-10 NOTE — Telephone Encounter (Signed)
Called and spoke with patient regarding Tammy's message. Told patient to stay with current settings on CPAP machine until office visit with Tammy on 05/23/21. Patient verbalized understanding and had no questions at this lime. Nothing further is needed at this time

## 2021-05-11 DIAGNOSIS — G4733 Obstructive sleep apnea (adult) (pediatric): Secondary | ICD-10-CM | POA: Diagnosis not present

## 2021-05-18 DIAGNOSIS — G4733 Obstructive sleep apnea (adult) (pediatric): Secondary | ICD-10-CM | POA: Diagnosis not present

## 2021-05-22 ENCOUNTER — Ambulatory Visit: Payer: BC Managed Care – PPO | Admitting: Primary Care

## 2021-05-23 ENCOUNTER — Ambulatory Visit (INDEPENDENT_AMBULATORY_CARE_PROVIDER_SITE_OTHER): Payer: BC Managed Care – PPO | Admitting: Adult Health

## 2021-05-23 ENCOUNTER — Other Ambulatory Visit: Payer: Self-pay

## 2021-05-23 ENCOUNTER — Encounter: Payer: Self-pay | Admitting: Adult Health

## 2021-05-23 VITALS — BP 140/76 | HR 72 | Temp 97.8°F | Ht 68.0 in | Wt 193.8 lb

## 2021-05-23 DIAGNOSIS — G4733 Obstructive sleep apnea (adult) (pediatric): Secondary | ICD-10-CM | POA: Diagnosis not present

## 2021-05-23 NOTE — Patient Instructions (Addendum)
Change CPAP pressure to 5 to 10cmH2O.  CPAP download in 1 month.  Continue on CPAP At bedtime   Wear CPAP all night long for at least 6hrs or more each night .  Work on healthy weight  Do not drive if sleepy  Saline nasal spray Twice daily   Saline nasal gel.  Follow up with Dr. Mortimer Fries or [place] in 6 months and As needed

## 2021-05-23 NOTE — Progress Notes (Signed)
@Patient  ID: Paul Goodwin, male    DOB: 1960-08-11, 61 y.o.   MRN: [id number]  Chief Complaint  Patient presents with   Follow-up    Referring provider: McLean-Scocuzza, Olivia Mackie *  HPI: 61 year old male seen for sleep consult January 16, 2021 to establish for sleep apnea.  Repeat home sleep study 02/15/21 showed severe obstructive sleep apnea DOT physical/CDL license-truck driver  Medical history significant for nasal polyposis  TEST/EVENTS :  AHI of 37.9 apneas/ hour, drops in oxygen level to a low of 77%.  05/23/2021 Follow up: OSA Patient returns for a 6-week follow-up.  Patient has underlying severe obstructive sleep apnea.  Patient recently had a repeat sleep study done on October 2022 that showed severe obstructive sleep apnea.  CPAP titration study on April 06, 2021 showed optimal control on CPAP 7 cm H2O with a small fullface mask.  Patient was recommended to begin on CPAP.  Since last visit patient says he is trying to wear his CPAP every single night.  Patient says it is a little uncomfortable with his current mask.  He also says it is causing his extreme dry mouth.  Feels pressure is too high . Patient is currently on auto CPAP 5 to 16 cm H2O.  CPAP download shows excellent compliance with 100% usage.  Daily average usage at 7 hours.  AHI 4.0/hour.  Positive mask leak. Feels he is benefiting from CPAP .   Truck driver, goes out of town each week.    No Known Allergies  Immunization History  Administered Date(s) Administered   Td 08/31/2009    Past Medical History:  Diagnosis Date   Arthritis    right wrist   Diabetes mellitus without complication ([place])    History of allergic rhinitis    History of gout    Sleep apnea    pt doesn't wear CPAP any longer   Wears glasses     Tobacco History: Social History   Tobacco Use  Smoking Status Never  Smokeless Tobacco Never   Counseling given: Not Answered   Outpatient Medications Prior to Visit   Medication Sig Dispense Refill   Apple Cider Vinegar 500 MG TABS      B Complex-C-Folic Acid (SUPER B COMPLEX/FA/VIT C) TABS      Coenzyme Q10 (COQ10 PO) Take by mouth daily.     cyanocobalamin 2000 MCG tablet Take 2,000 mcg by mouth daily.     Garlic 5188 MG CAPS      glucose blood (ONE TOUCH TEST STRIPS) test strip Use as instructed to test blood sugar once daily E11.9 RELION PRIME STRIPS 100 each 12   Magnesium 250 MG TABS      Multiple Vitamin (MULTIVITAMINS PO) Take by mouth daily.     NON FORMULARY daily. Testosterone booster     Omega 3 1000 MG CAPS      ReliOn Ultra Thin Lancets MISC 1 Device by Does not apply route daily. Lancets approved with machine has relion machine 100 each 3   Testosterone 100 MG PLLT      TURMERIC PO Take by mouth.     UNABLE TO FIND Med Name: extra virgin olive oil 1 tablespoon     No facility-administered medications prior to visit.     Review of Systems:   Constitutional:   No  weight loss, night sweats,  Fevers, chills, fatigue, or  lassitude.  HEENT:   No headaches,  Difficulty swallowing,  Tooth/dental problems, or  Sore throat,  No sneezing, itching, ear ache, nasal congestion, post nasal drip,   CV:  No chest pain,  Orthopnea, PND, swelling in lower extremities, anasarca, dizziness, palpitations, syncope.   GI  No heartburn, indigestion, abdominal pain, nausea, vomiting, diarrhea, change in bowel habits, loss of appetite, bloody stools.   Resp: No shortness of breath with exertion or at rest.  No excess mucus, no productive cough,  No non-productive cough,  No coughing up of blood.  No change in color of mucus.  No wheezing.  No chest wall deformity  Skin: no rash or lesions.  GU: no dysuria, change in color of urine, no urgency or frequency.  No flank pain, no hematuria   MS:  No joint pain or swelling.  No decreased range of motion.  No back pain.    Physical Exam  BP 140/76 (BP Location: Left Arm, Patient Position:  Sitting, Cuff Size: Normal)    Pulse 72    Temp 97.8 F (36.6 C) (Oral)    Ht 5\' 8"  (1.727 m)    Wt 193 lb 12.8 oz (87.9 kg)    SpO2 99%    BMI 29.47 kg/m   GEN: A/Ox3; pleasant , NAD, well nourished    HEENT:  [place]/AT,   , NOSE-clear, THROAT-clear, no lesions, no postnasal drip or exudate noted.  Class 2 MP airway   NECK:  Supple w/ fair ROM; no JVD; normal carotid impulses w/o bruits; no thyromegaly or nodules palpated; no lymphadenopathy.    RESP  Clear  P & A; w/o, wheezes/ rales/ or rhonchi. no accessory muscle use, no dullness to percussion  CARD:  RRR, no m/r/g, no peripheral edema, pulses intact, no cyanosis or clubbing.  GI:   Soft & nt; nml bowel sounds; no organomegaly or masses detected.   Musco: Warm bil, no deformities or joint swelling noted.   Neuro: alert, no focal deficits noted.    Skin: Warm, no lesions or rashes    Lab Results:    BMET   BNP No results found for: BNP  ProBNP No results found for: PROBNP  Imaging: No results found.    No flowsheet data found.  No results found for: NITRICOXIDE      Assessment & Plan:   Sleep apnea Severe OSA - excellent control and compliance  Will adjust pressure for comfort  CPAP titration optimal at 7 cm , will change to auto set 5-10   Plan  Patient Instructions  Change CPAP pressure to 5 to 10cmH2O.  CPAP download in 1 month.  Continue on CPAP At bedtime   Wear CPAP all night long for at least 6hrs or more each night .  Work on healthy weight  Do not drive if sleepy  Saline nasal spray Twice daily   Saline nasal gel.  Follow up with Dr. Mortimer Fries or Kalliope Riesen in 6 months and As needed         Rexene Edison, NP 05/23/2021

## 2021-05-23 NOTE — Assessment & Plan Note (Signed)
Severe OSA - excellent control and compliance  Will adjust pressure for comfort  CPAP titration optimal at 7 cm , will change to auto set 5-10   Plan  Patient Instructions  Change CPAP pressure to 5 to 10cmH2O.  CPAP download in 1 month.  Continue on CPAP At bedtime   Wear CPAP all night long for at least 6hrs or more each night .  Work on healthy weight  Do not drive if sleepy  Saline nasal spray Twice daily   Saline nasal gel.  Follow up with Dr. Mortimer Fries or Mareesa Gathright in 6 months and As needed

## 2021-05-31 ENCOUNTER — Encounter: Payer: Self-pay | Admitting: Internal Medicine

## 2021-06-07 ENCOUNTER — Telehealth: Payer: BC Managed Care – PPO | Admitting: Internal Medicine

## 2021-06-11 DIAGNOSIS — G4733 Obstructive sleep apnea (adult) (pediatric): Secondary | ICD-10-CM | POA: Diagnosis not present

## 2021-06-13 ENCOUNTER — Encounter: Payer: Self-pay | Admitting: Internal Medicine

## 2021-06-13 ENCOUNTER — Ambulatory Visit (INDEPENDENT_AMBULATORY_CARE_PROVIDER_SITE_OTHER): Payer: BC Managed Care – PPO | Admitting: Internal Medicine

## 2021-06-13 ENCOUNTER — Other Ambulatory Visit: Payer: Self-pay

## 2021-06-13 VITALS — Ht 68.0 in | Wt 194.1 lb

## 2021-06-13 DIAGNOSIS — R7303 Prediabetes: Secondary | ICD-10-CM

## 2021-06-13 DIAGNOSIS — Z0000 Encounter for general adult medical examination without abnormal findings: Secondary | ICD-10-CM

## 2021-06-13 DIAGNOSIS — M109 Gout, unspecified: Secondary | ICD-10-CM

## 2021-06-13 DIAGNOSIS — Z1211 Encounter for screening for malignant neoplasm of colon: Secondary | ICD-10-CM

## 2021-06-13 DIAGNOSIS — R001 Bradycardia, unspecified: Secondary | ICD-10-CM | POA: Diagnosis not present

## 2021-06-13 DIAGNOSIS — Z125 Encounter for screening for malignant neoplasm of prostate: Secondary | ICD-10-CM

## 2021-06-13 DIAGNOSIS — Z1329 Encounter for screening for other suspected endocrine disorder: Secondary | ICD-10-CM

## 2021-06-13 DIAGNOSIS — E785 Hyperlipidemia, unspecified: Secondary | ICD-10-CM

## 2021-06-13 DIAGNOSIS — D126 Benign neoplasm of colon, unspecified: Secondary | ICD-10-CM

## 2021-06-13 DIAGNOSIS — Z1389 Encounter for screening for other disorder: Secondary | ICD-10-CM

## 2021-06-13 HISTORY — DX: Prediabetes: R73.03

## 2021-06-13 NOTE — Progress Notes (Signed)
Telephone Note  I connected with Paul Goodwin  on 06/13/21 at  8:30 AM EST by telephone and verified that I am speaking with the correct person using two identifiers.  Location patient: [place] Location provider:work or home office Persons participating in the virtual visit: patient, provider  I discussed the limitations and requested verbal permission for telemedicine visit. The patient expressed understanding and agreed to proceed.   HPI:  Acute telemedicine visit for : Annual  Due for colonoscopy, Tdap, had eye exam w/in the last year [employer]. 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  Past Medical History:  Diagnosis Date   Arthritis    right wrist   Diabetes mellitus without complication (HCC)    History of allergic rhinitis    History of gout    Sleep apnea    pt doesn't wear CPAP any longer   Wears glasses     Past Surgical History:  Procedure Laterality Date   FRACTURE SURGERY     leg, feet, (hardware in left leg); 6 operations total per pt report   HARDWARE REMOVAL     left leg   HERNIA REPAIR     right leg     has rod placed and then removed   right shoulder surgery     had to reattach muscle and tendons to shoulder     Current Outpatient Medications:    Apple Cider Vinegar 500 MG TABS, , Disp: , Rfl:    B Complex-C-Folic Acid (SUPER B COMPLEX/FA/VIT C) TABS, , Disp: , Rfl:    Coenzyme Q10 (COQ10 PO), Take by mouth daily., Disp: , Rfl:    cyanocobalamin 2000 MCG tablet, Take 2,000 mcg by mouth daily., Disp: , Rfl:    Garlic 0109 MG CAPS, , Disp: , Rfl:    glucose blood (ONE TOUCH TEST STRIPS) test strip, Use as instructed to test blood sugar once daily  E11.9 RELION PRIME STRIPS, Disp: 100 each, Rfl: 12   Magnesium 250 MG TABS, , Disp: , Rfl:    Multiple Vitamin (MULTIVITAMINS PO), Take by mouth daily., Disp: , Rfl:    Omega 3 1000 MG CAPS, , Disp: , Rfl:    ReliOn Ultra Thin Lancets MISC, 1 Device by Does not apply route daily. Lancets approved with machine has relion machine, Disp: 100 each, Rfl: 3   Testosterone 100 MG PLLT, , Disp: , Rfl:    TURMERIC PO, Take by mouth., Disp: , Rfl:    UNABLE TO FIND, Med Name: extra virgin olive oil 1 tablespoon, Disp: , Rfl:   EXAM:  VITALS per patient if applicable:  GENERAL: alert, oriented, appears well and in no acute distress   PSYCH/NEURO: pleasant and cooperative, no obvious depression or anxiety, speech and thought processing grossly intact  ASSESSMENT AND PLAN:  Discussed the following assessment and plan:  Annual  See below   Tubular adenoma of colon - Plan: Ambulatory referral to Gastroenterology Screening for colon cancer - Plan: Ambulatory referral to Gastroenterology  Bradycardia - Plan: Comprehensive metabolic panel, TSH  Hyperlipidemia, unspecified hyperlipidemia type - Plan: Comprehensive metabolic panel, Lipid panel, CBC with Differential/Platelet Declined statin in the past   Gout,w/o flare not on medications tries  to control via diet- Plan: CBC with Differential/Platelet, Uric acid  Prediabetes - Plan: Hemoglobin A1c  Screening for prostate cancer - Plan: PSA  HM Declines vaccine (flu, pna 23, shingles, hep B vaccines, covid shot) Tdap due rec    sch fasting labs asap 08/2021 Monday   Colonoscopy had Dr. Hilarie Fredrickson 05/07/14 tubular adenoma needs f/u in 5 years  --> As of 05/2021 pt wants to change to [facility] due to assess/closer to home       Dermatology consider in future Aks to changes mild and early rec sunscreen   Never smoker wife is exposed 2nd hand  Declines STD check   PSA normal 05/2020 ordered    rec healthy diet and exercise   -we discussed  possible serious and likely etiologies, options for evaluation and workup, limitations of telemedicine visit vs in person visit, treatment, treatment risks and precautions. Pt prefers to treat via telemedicine empirically rather than in person at this moment.    I discussed the assessment and treatment plan with the patient. The patient was provided an opportunity to ask questions and all were answered. The patient agreed with the plan and demonstrated an understanding of the instructions.    Time spent 20 min Delorise Jackson, MD

## 2021-06-18 DIAGNOSIS — G4733 Obstructive sleep apnea (adult) (pediatric): Secondary | ICD-10-CM | POA: Diagnosis not present

## 2021-06-19 DIAGNOSIS — G4733 Obstructive sleep apnea (adult) (pediatric): Secondary | ICD-10-CM | POA: Diagnosis not present

## 2021-07-09 DIAGNOSIS — G4733 Obstructive sleep apnea (adult) (pediatric): Secondary | ICD-10-CM | POA: Diagnosis not present

## 2021-07-16 DIAGNOSIS — G4733 Obstructive sleep apnea (adult) (pediatric): Secondary | ICD-10-CM | POA: Diagnosis not present

## 2021-08-09 DIAGNOSIS — G4733 Obstructive sleep apnea (adult) (pediatric): Secondary | ICD-10-CM | POA: Diagnosis not present

## 2021-09-08 DIAGNOSIS — G4733 Obstructive sleep apnea (adult) (pediatric): Secondary | ICD-10-CM | POA: Diagnosis not present

## 2021-09-11 ENCOUNTER — Other Ambulatory Visit (INDEPENDENT_AMBULATORY_CARE_PROVIDER_SITE_OTHER): Payer: BC Managed Care – PPO

## 2021-09-11 DIAGNOSIS — R7303 Prediabetes: Secondary | ICD-10-CM | POA: Diagnosis not present

## 2021-09-11 DIAGNOSIS — Z1389 Encounter for screening for other disorder: Secondary | ICD-10-CM | POA: Diagnosis not present

## 2021-09-11 DIAGNOSIS — Z1329 Encounter for screening for other suspected endocrine disorder: Secondary | ICD-10-CM | POA: Diagnosis not present

## 2021-09-11 DIAGNOSIS — Z125 Encounter for screening for malignant neoplasm of prostate: Secondary | ICD-10-CM | POA: Diagnosis not present

## 2021-09-11 DIAGNOSIS — R001 Bradycardia, unspecified: Secondary | ICD-10-CM

## 2021-09-11 DIAGNOSIS — E785 Hyperlipidemia, unspecified: Secondary | ICD-10-CM | POA: Diagnosis not present

## 2021-09-11 DIAGNOSIS — M109 Gout, unspecified: Secondary | ICD-10-CM | POA: Diagnosis not present

## 2021-09-11 LAB — CBC WITH DIFFERENTIAL/PLATELET
Basophils Absolute: 0.1 10*3/uL (ref 0.0–0.1)
Basophils Relative: 1.3 % (ref 0.0–3.0)
Eosinophils Absolute: 0.4 10*3/uL (ref 0.0–0.7)
Eosinophils Relative: 5.6 % — ABNORMAL HIGH (ref 0.0–5.0)
HCT: 42.4 % (ref 39.0–52.0)
Hemoglobin: 14.1 g/dL (ref 13.0–17.0)
Lymphocytes Relative: 25.8 % (ref 12.0–46.0)
Lymphs Abs: 1.6 10*3/uL (ref 0.7–4.0)
MCHC: 33.4 g/dL (ref 30.0–36.0)
MCV: 93.3 fl (ref 78.0–100.0)
Monocytes Absolute: 0.5 10*3/uL (ref 0.1–1.0)
Monocytes Relative: 7.9 % (ref 3.0–12.0)
Neutro Abs: 3.7 10*3/uL (ref 1.4–7.7)
Neutrophils Relative %: 59.4 % (ref 43.0–77.0)
Platelets: 246 10*3/uL (ref 150.0–400.0)
RBC: 4.54 Mil/uL (ref 4.22–5.81)
RDW: 13.4 % (ref 11.5–15.5)
WBC: 6.3 10*3/uL (ref 4.0–10.5)

## 2021-09-11 LAB — COMPREHENSIVE METABOLIC PANEL
ALT: 18 U/L (ref 0–53)
AST: 17 U/L (ref 0–37)
Albumin: 4.5 g/dL (ref 3.5–5.2)
Alkaline Phosphatase: 71 U/L (ref 39–117)
BUN: 14 mg/dL (ref 6–23)
CO2: 29 mEq/L (ref 19–32)
Calcium: 8.9 mg/dL (ref 8.4–10.5)
Chloride: 103 mEq/L (ref 96–112)
Creatinine, Ser: 1.07 mg/dL (ref 0.40–1.50)
GFR: 76.09 mL/min (ref >60.00)
Glucose, Bld: 125 mg/dL — ABNORMAL HIGH (ref 70–99)
Potassium: 4.1 mEq/L (ref 3.5–5.1)
Sodium: 139 mEq/L (ref 135–145)
Total Bilirubin: 0.6 mg/dL (ref 0.2–1.2)
Total Protein: 6.8 g/dL (ref 6.0–8.3)

## 2021-09-11 LAB — LIPID PANEL
Cholesterol: 258 mg/dL — ABNORMAL HIGH (ref 0–200)
HDL: 58.3 mg/dL (ref >39.00)
LDL Cholesterol: 178 mg/dL — ABNORMAL HIGH (ref 0–99)
NonHDL: 199.4
Total CHOL/HDL Ratio: 4
Triglycerides: 107 mg/dL (ref 0.0–149.0)
VLDL: 21.4 mg/dL (ref 0.0–40.0)

## 2021-09-11 LAB — URIC ACID: Uric Acid, Serum: 7.6 mg/dL (ref 4.0–7.8)

## 2021-09-11 LAB — TSH: TSH: 2.86 u[IU]/mL (ref 0.35–5.50)

## 2021-09-11 LAB — PSA: PSA: 0.72 ng/mL (ref 0.10–4.00)

## 2021-09-11 LAB — HEMOGLOBIN A1C: Hgb A1c MFr Bld: 6.2 % (ref 4.6–6.5)

## 2021-09-12 ENCOUNTER — Telehealth: Payer: Self-pay

## 2021-09-12 LAB — URINALYSIS, ROUTINE W REFLEX MICROSCOPIC
Bilirubin Urine: NEGATIVE
Glucose, UA: NEGATIVE
Hgb urine dipstick: NEGATIVE
Ketones, ur: NEGATIVE
Leukocytes,Ua: NEGATIVE
Nitrite: NEGATIVE
Protein, ur: NEGATIVE
Specific Gravity, Urine: 1.013 (ref 1.001–1.035)
pH: 5.5 (ref 5.0–8.0)

## 2021-09-12 NOTE — Telephone Encounter (Signed)
Pt returning call

## 2021-09-12 NOTE — Telephone Encounter (Signed)
Called pt to disc lab results. Pt unable to hear me nor could I hear him.  Per Dr.Tracy: Blood counts normal but elevated eosinophils indicate allergies Cholesterol worse  -does pt want to try statin low dose or non statin zetia goal total cholesterol <200 and ldl <100  Sugar elevated +prediabetes worse 6.2 if 6.5=diabetes if were 5.6 no prediabetes Liver kidneys normal  Uric acid reduced but the goal would be <6.0 this is gout # Thyroid lab normal PSA prostate cancer screening normal  Urine normal

## 2021-09-20 DIAGNOSIS — G4733 Obstructive sleep apnea (adult) (pediatric): Secondary | ICD-10-CM | POA: Diagnosis not present

## 2021-10-09 DIAGNOSIS — G4733 Obstructive sleep apnea (adult) (pediatric): Secondary | ICD-10-CM | POA: Diagnosis not present

## 2021-10-20 DIAGNOSIS — G4733 Obstructive sleep apnea (adult) (pediatric): Secondary | ICD-10-CM | POA: Diagnosis not present

## 2021-11-08 DIAGNOSIS — G4733 Obstructive sleep apnea (adult) (pediatric): Secondary | ICD-10-CM | POA: Diagnosis not present

## 2021-11-20 DIAGNOSIS — G4733 Obstructive sleep apnea (adult) (pediatric): Secondary | ICD-10-CM | POA: Diagnosis not present

## 2021-11-21 ENCOUNTER — Ambulatory Visit (INDEPENDENT_AMBULATORY_CARE_PROVIDER_SITE_OTHER): Payer: BC Managed Care – PPO | Admitting: Adult Health

## 2021-11-21 ENCOUNTER — Encounter: Payer: Self-pay | Admitting: Adult Health

## 2021-11-21 VITALS — BP 124/68 | HR 54 | Temp 97.9°F | Ht 68.0 in | Wt 199.6 lb

## 2021-11-21 DIAGNOSIS — G4733 Obstructive sleep apnea (adult) (pediatric): Secondary | ICD-10-CM | POA: Diagnosis not present

## 2021-11-21 DIAGNOSIS — Z9989 Dependence on other enabling machines and devices: Secondary | ICD-10-CM | POA: Diagnosis not present

## 2021-11-21 NOTE — Progress Notes (Signed)
$'[name]$  ID: Paul Goodwin, male    DOB: 26-Aug-1960, 61 y.o.   MRN: [id number]  Chief Complaint  Patient presents with   Follow-up    Referring provider: McLean-Scocuzza, Olivia Mackie *  HPI: 61 year old male seen for sleep consult January 16, 2021 to establish for sleep apnea..  Repeat sleep study October 2022 showed severe sleep apnea. 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  CPAP titration study on April 06, 2021 showed optimal control on CPAP 7 cm H2O with a small fullface mask.   11/21/2021 Follow up : OSA  Patient presents for a 39-monthfollow-up.  Patient has underlying severe sleep apnea.  He is on nocturnal CPAP.  A CPAP titration study showed optimal control on CPAP 7 cm.  Patient says he is wearing his CPAP most every night.  Says that he still is restless with the CPAP.  Mask is still not comfortable.  But for the most part he is trying to wear it each night.  Does continue to complain of dry mouth.  CPAP download shows 87% compliance daily average usage at 6 hours.  Patient is on auto CPAP 5 to 10 cm H2O.  AHI 1.9/hour daily average pressure at 9 cm H2O.   No Known Allergies  Immunization History  Administered Date(s) Administered   Td 08/31/2009    Past Medical History:  Diagnosis Date   Arthritis    right wrist   [place]    History of allergic rhinitis    History of gout    Sleep apnea    pt doesn't wear CPAP any longer   Wears glasses     Tobacco History: Social History   Tobacco Use  Smoking Status Never  Smokeless Tobacco Never   Counseling given: Not Answered   Outpatient Medications Prior to Visit  Medication Sig Dispense Refill   Apple Cider Vinegar 500 MG TABS      B Complex-C-Folic Acid (SUPER B COMPLEX/FA/VIT C) TABS      Coenzyme Q10  (COQ10 PO) Take by mouth daily.     cyanocobalamin 2000 MCG tablet Take 2,000 mcg by mouth daily.     Garlic 18101MG CAPS      glucose blood (ONE TOUCH TEST STRIPS) test strip Use as instructed to test blood sugar once daily E11.9 RELION PRIME STRIPS 100 each 12   Magnesium 250 MG TABS      Multiple Vitamin (MULTIVITAMINS PO) Take by mouth daily.     Omega 3 1000 MG CAPS      ReliOn Ultra Thin Lancets MISC 1 Device by Does not apply route daily. Lancets approved with machine has relion machine 100 each 3   Testosterone 100 MG PLLT      TURMERIC PO Take by mouth.     UNABLE TO FIND Med Name: extra virgin olive oil 1 tablespoon     No facility-administered medications prior to visit.     Review of Systems:   Constitutional:   No  weight loss, night sweats,  Fevers, chills, fatigue, or  lassitude.  HEENT:   No headaches,  Difficulty swallowing,  Tooth/dental problems, or  Sore throat,                No sneezing, itching, ear ache, nasal congestion, post nasal drip,  CV:  No chest pain,  Orthopnea, PND, swelling in lower extremities, anasarca, dizziness, palpitations, syncope.   GI  No heartburn, indigestion, abdominal pain, nausea, vomiting, diarrhea, change in bowel habits, loss of appetite, bloody stools.   Resp: No shortness of breath with exertion or at rest.  No excess mucus, no productive cough,  No non-productive cough,  No coughing up of blood.  No change in color of mucus.  No wheezing.  No chest wall deformity  Skin: no rash or lesions.  GU: no dysuria, change in color of urine, no urgency or frequency.  No flank pain, no hematuria   MS:  No joint pain or swelling.  No decreased range of motion.  No back pain.    Physical Exam  BP 124/68 (BP Location: Left Arm, Cuff Size: Normal)   Pulse (!) 54   Temp 97.9 F (36.6 C) (Temporal)   Ht '5\' 8"'$  (1.727 m)   Wt 199 lb 9.6 oz (90.5 kg)   SpO2 98%   BMI 30.35 kg/m   GEN: A/Ox3; pleasant , NAD, well nourished     HEENT:  [place]/AT,  EACs-clear, TMs-wnl, NOSE-clear, THROAT-clear, no lesions, no postnasal drip or exudate noted. Class 2-3 MP airway   NECK:  Supple w/ fair ROM; no JVD; normal carotid impulses w/o bruits; no thyromegaly or nodules palpated; no lymphadenopathy.    RESP  Clear  P & A; w/o, wheezes/ rales/ or rhonchi. no accessory muscle use, no dullness to percussion  CARD:  RRR, no m/r/g, no peripheral edema, pulses intact, no cyanosis or clubbing.  GI:   Soft & nt; nml bowel sounds; no organomegaly or masses detected.   Musco: Warm bil, no deformities or joint swelling noted.   Neuro: alert, no focal deficits noted.    Skin: Warm, no lesions or rashes      BNP No results found for: "BNP"  ProBNP No results found for: "PROBNP"  Imaging: No results found.        No data to display          No results found for: "NITRICOXIDE"      Assessment & Plan:   OSA on CPAP Encouraged on CPAP compliance.  Helpful hints discussed with CPAP usage.  We will adjust CPAP pressure to 7 cm H2O.  This is the CPAP pressure that he was most optimally controlled on in his CPAP titration study.  Hopefully this will be more comfortable for him. Add in saline nasal spray and gel for nasal dryness  Plan  Patient Instructions  Change CPAP pressure to 7 cm H2O .  CPAP download in 1 month.  Continue on CPAP At bedtime   Wear CPAP all night long for at least 6hrs or more each night .  Work on healthy weight  Do not drive if sleepy  Saline nasal spray Twice daily   Saline nasal gel At bedtime  .  Follow up with Dr. Mortimer Fries or Geanette Buonocore in 1 year and As needed         Rexene Edison, NP 11/21/2021

## 2021-11-21 NOTE — Patient Instructions (Signed)
Change CPAP pressure to 7 cm H2O .  CPAP download in 1 month.  Continue on CPAP At bedtime   Wear CPAP all night long for at least 6hrs or more each night .  Work on healthy weight  Do not drive if sleepy  Saline nasal spray Twice daily   Saline nasal gel At bedtime  .  Follow up with Dr. Mortimer Fries or Victorya Hillman in 1 year and As needed

## 2021-11-21 NOTE — Assessment & Plan Note (Signed)
Encouraged on CPAP compliance.  Helpful hints discussed with CPAP usage.  We will adjust CPAP pressure to 7 cm H2O.  This is the CPAP pressure that he was most optimally controlled on in his CPAP titration study.  Hopefully this will be more comfortable for him. Add in saline nasal spray and gel for nasal dryness  Plan  Patient Instructions  Change CPAP pressure to 7 cm H2O .  CPAP download in 1 month.  Continue on CPAP At bedtime   Wear CPAP all night long for at least 6hrs or more each night .  Work on healthy weight  Do not drive if sleepy  Saline nasal spray Twice daily   Saline nasal gel At bedtime  .  Follow up with Dr. Mortimer Fries or Colum Colt in 1 year and As needed

## 2021-12-09 DIAGNOSIS — G4733 Obstructive sleep apnea (adult) (pediatric): Secondary | ICD-10-CM | POA: Diagnosis not present

## 2021-12-20 DIAGNOSIS — G4733 Obstructive sleep apnea (adult) (pediatric): Secondary | ICD-10-CM | POA: Diagnosis not present

## 2021-12-21 DIAGNOSIS — G4733 Obstructive sleep apnea (adult) (pediatric): Secondary | ICD-10-CM | POA: Diagnosis not present

## 2022-01-09 DIAGNOSIS — G4733 Obstructive sleep apnea (adult) (pediatric): Secondary | ICD-10-CM | POA: Diagnosis not present

## 2022-01-12 ENCOUNTER — Telehealth: Payer: Self-pay | Admitting: *Deleted

## 2022-01-12 NOTE — Telephone Encounter (Signed)
CPAP compliance report per Rexene Edison NP Looks great, keep up the good work.  No changes to settings.  Called and left detailed vm per DPR.  Advised to call with any questions.

## 2022-01-20 DIAGNOSIS — G4733 Obstructive sleep apnea (adult) (pediatric): Secondary | ICD-10-CM | POA: Diagnosis not present

## 2022-02-19 DIAGNOSIS — G4733 Obstructive sleep apnea (adult) (pediatric): Secondary | ICD-10-CM | POA: Diagnosis not present

## 2022-03-20 DIAGNOSIS — G4733 Obstructive sleep apnea (adult) (pediatric): Secondary | ICD-10-CM | POA: Diagnosis not present

## 2022-03-23 DIAGNOSIS — G4733 Obstructive sleep apnea (adult) (pediatric): Secondary | ICD-10-CM | POA: Diagnosis not present

## 2022-04-19 DIAGNOSIS — G4733 Obstructive sleep apnea (adult) (pediatric): Secondary | ICD-10-CM | POA: Diagnosis not present

## 2022-04-27 ENCOUNTER — Telehealth: Payer: Self-pay | Admitting: Adult Health

## 2022-04-27 NOTE — Telephone Encounter (Signed)
Report has been placed up front for pickup.  Patient's spouse, Iris(DPR) is aware and voiced her understanding.  Nothing further needed.

## 2022-05-20 DIAGNOSIS — G4733 Obstructive sleep apnea (adult) (pediatric): Secondary | ICD-10-CM | POA: Diagnosis not present

## 2022-06-30 DIAGNOSIS — M7989 Other specified soft tissue disorders: Secondary | ICD-10-CM | POA: Diagnosis not present

## 2022-08-16 DIAGNOSIS — G4733 Obstructive sleep apnea (adult) (pediatric): Secondary | ICD-10-CM | POA: Diagnosis not present

## 2022-09-24 DIAGNOSIS — B351 Tinea unguium: Secondary | ICD-10-CM | POA: Diagnosis not present

## 2022-09-24 DIAGNOSIS — L600 Ingrowing nail: Secondary | ICD-10-CM | POA: Diagnosis not present

## 2022-09-24 DIAGNOSIS — M79672 Pain in left foot: Secondary | ICD-10-CM | POA: Diagnosis not present

## 2022-09-24 DIAGNOSIS — M778 Other enthesopathies, not elsewhere classified: Secondary | ICD-10-CM | POA: Diagnosis not present

## 2022-09-26 DIAGNOSIS — G4733 Obstructive sleep apnea (adult) (pediatric): Secondary | ICD-10-CM | POA: Diagnosis not present

## 2022-10-01 ENCOUNTER — Encounter: Payer: BC Managed Care – PPO | Admitting: Family Medicine

## 2022-10-08 ENCOUNTER — Ambulatory Visit: Payer: BC Managed Care – PPO | Admitting: Internal Medicine

## 2022-10-08 ENCOUNTER — Encounter: Payer: Self-pay | Admitting: Internal Medicine

## 2022-10-08 VITALS — BP 126/74 | HR 89 | Temp 97.7°F | Ht 68.0 in | Wt 200.8 lb

## 2022-10-08 DIAGNOSIS — G4733 Obstructive sleep apnea (adult) (pediatric): Secondary | ICD-10-CM

## 2022-10-08 NOTE — Patient Instructions (Addendum)
Continue CPAP as prescribed  EXCELLENT JOB!! A+!!!! 

## 2022-10-08 NOTE — Progress Notes (Signed)
@Patient  ID: Paul Goodwin, male    DOB: 01/25/1961, 62 y.o.   MRN: [id number]    SYNOPSIS 62 year old male seen for sleep consult January 16, 2021 to establish for sleep apnea..  Repeat sleep study October 2022 showed severe sleep apnea. 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  CPAP titration study on April 06, 2021 showed optimal control on CPAP 7 cm H2O with a small fullface mask.    CC Follow up assessment of OSA   HPI  10/08/2022  Follow-up assessment of OSA  Patient has underlying severe sleep apnea  Patient has been on nocturnal CPAP for several years CPAP 7 cm   Compliance report May 2024 reviewed in detail with patient Apart from power outage patient has excellent compliance Greater than 90% for days and greater than 90% for greater than 4 hours AHI significantly reduced to 1.2 Previous AHI is 38  No evidence of heart failure at this time No evidence or signs of infection at this time No respiratory distress No fevers, chills, nausea, vomiting, diarrhea No evidence of lower extremity edema No evidence hemoptysis    No Known Allergies  Immunization History  Administered Date(s) Administered   Td 08/31/2009    Past Medical History:  Diagnosis Date   Arthritis    right wrist   Diabetes mellitus without complication (HCC)    History of allergic rhinitis    History of gout    Sleep apnea    pt doesn't wear CPAP any longer   Wears glasses     Tobacco History: Social History   Tobacco Use  Smoking Status Never  Smokeless Tobacco Never   Counseling given: Not Answered   Outpatient Medications Prior to Visit  Medication Sig Dispense Refill   Apple Cider Vinegar 500 MG TABS      B Complex-C-Folic Acid (SUPER B COMPLEX/FA/VIT C) TABS      Coenzyme Q10 (COQ10 PO) Take by mouth  daily.     cyanocobalamin 2000 MCG tablet Take 2,000 mcg by mouth daily.     Garlic 1000 MG CAPS      glucose blood (ONE TOUCH TEST STRIPS) test strip Use as instructed to test blood sugar once daily E11.9 RELION PRIME STRIPS 100 each 12   Magnesium 250 MG TABS      Multiple Vitamin (MULTIVITAMINS PO) Take by mouth daily.     Omega 3 1000 MG CAPS      ReliOn Ultra Thin Lancets MISC 1 Device by Does not apply route daily. Lancets approved with machine has relion machine 100 each 3   Testosterone 100 MG PLLT      TURMERIC PO Take by mouth.     UNABLE TO FIND Med Name: extra virgin olive oil 1 tablespoon     No facility-administered medications prior to visit.      BP 126/74 (BP Location: Left Arm, Cuff Size: Normal)   Pulse 89   Temp 97.7 F (36.5 C) (Temporal)   Ht 5\' 8"  (1.727 m)   Wt 200 lb 12.8 oz (91.1 kg)   SpO2 98%   BMI 30.53 kg/m      Review of Systems: Gen:  Denies  fever, sweats, chills weight loss  HEENT: Denies blurred vision, double vision, ear pain, eye pain, hearing loss, nose bleeds, sore throat Cardiac:  No dizziness, chest pain or heaviness, chest tightness,edema, No JVD Resp:   No cough, -sputum production, -shortness of breath,-wheezing, -hemoptysis,  Other:  All other systems negative   Physical Examination:   General Appearance: No distress  EYES PERRLA, EOM intact.   NECK Supple, No JVD Pulmonary: normal breath sounds, No wheezing.  CardiovascularNormal S1,S2.  No m/r/g.   Abdomen: Benign, Soft, non-tender. Neurology UE/LE 5/5 strength, no focal deficits Ext pulses intact, cap refill intact ALL OTHER ROS ARE NEGATIVE       Assessment & Plan:   62 year old pleasant white male seen today for assessment for severe sleep apnea Patient currently on CPAP of 7 cm water pressure previous AHI was documented as 38   OSA on CPAP Continue CPAP as prescribed Currently on 7 cm of water pressure Uses nasal pillows Patient is very  compliant Patient shows excellent usage of CPAP machine His OSA is well-controlled and is reduced to 1.2 Patient uses and benefits from therapy      CURRENT MEDICATIONS REVIEWED AT LENGTH WITH PATIENT TODAY   Patient  satisfied with Plan of action and management. All questions answered  Follow up 1 year  Total Time Spent  21 mins   Paul Goodwin Paul Goodwin, M.D.  [facility]  Medical Director [facility] Medical Director [facility] Cardio-Pulmonary Department

## 2022-10-26 DIAGNOSIS — G4733 Obstructive sleep apnea (adult) (pediatric): Secondary | ICD-10-CM | POA: Diagnosis not present

## 2022-10-29 DIAGNOSIS — S63502A Unspecified sprain of left wrist, initial encounter: Secondary | ICD-10-CM | POA: Diagnosis not present

## 2022-11-01 DIAGNOSIS — M25532 Pain in left wrist: Secondary | ICD-10-CM | POA: Diagnosis not present

## 2022-11-05 ENCOUNTER — Encounter: Payer: BC Managed Care – PPO | Admitting: Family Medicine

## 2022-11-05 ENCOUNTER — Ambulatory Visit: Payer: BC Managed Care – PPO | Admitting: Internal Medicine

## 2022-11-06 DIAGNOSIS — M25532 Pain in left wrist: Secondary | ICD-10-CM | POA: Diagnosis not present

## 2022-11-13 DIAGNOSIS — M19032 Primary osteoarthritis, left wrist: Secondary | ICD-10-CM | POA: Diagnosis not present

## 2022-11-26 DIAGNOSIS — G4733 Obstructive sleep apnea (adult) (pediatric): Secondary | ICD-10-CM | POA: Diagnosis not present

## 2022-12-03 ENCOUNTER — Encounter: Payer: BC Managed Care – PPO | Admitting: Family Medicine

## 2022-12-06 ENCOUNTER — Encounter (INDEPENDENT_AMBULATORY_CARE_PROVIDER_SITE_OTHER): Payer: Self-pay

## 2022-12-25 DIAGNOSIS — G4733 Obstructive sleep apnea (adult) (pediatric): Secondary | ICD-10-CM | POA: Diagnosis not present

## 2022-12-27 DIAGNOSIS — G4733 Obstructive sleep apnea (adult) (pediatric): Secondary | ICD-10-CM | POA: Diagnosis not present

## 2023-01-21 ENCOUNTER — Ambulatory Visit: Payer: BC Managed Care – PPO | Admitting: Family Medicine

## 2023-01-21 ENCOUNTER — Encounter: Payer: Self-pay | Admitting: Family Medicine

## 2023-01-21 ENCOUNTER — Ambulatory Visit: Payer: BC Managed Care – PPO

## 2023-01-21 VITALS — BP 126/78 | HR 66 | Temp 97.9°F | Resp 16 | Ht 68.0 in | Wt 204.0 lb

## 2023-01-21 DIAGNOSIS — M109 Gout, unspecified: Secondary | ICD-10-CM | POA: Diagnosis not present

## 2023-01-21 DIAGNOSIS — M19041 Primary osteoarthritis, right hand: Secondary | ICD-10-CM | POA: Diagnosis not present

## 2023-01-21 DIAGNOSIS — E118 Type 2 diabetes mellitus with unspecified complications: Secondary | ICD-10-CM | POA: Diagnosis not present

## 2023-01-21 DIAGNOSIS — R600 Localized edema: Secondary | ICD-10-CM

## 2023-01-21 DIAGNOSIS — G4733 Obstructive sleep apnea (adult) (pediatric): Secondary | ICD-10-CM

## 2023-01-21 DIAGNOSIS — E785 Hyperlipidemia, unspecified: Secondary | ICD-10-CM

## 2023-01-21 DIAGNOSIS — M7989 Other specified soft tissue disorders: Secondary | ICD-10-CM | POA: Diagnosis not present

## 2023-01-21 DIAGNOSIS — B351 Tinea unguium: Secondary | ICD-10-CM | POA: Insufficient documentation

## 2023-01-21 DIAGNOSIS — E559 Vitamin D deficiency, unspecified: Secondary | ICD-10-CM

## 2023-01-21 DIAGNOSIS — Z1211 Encounter for screening for malignant neoplasm of colon: Secondary | ICD-10-CM | POA: Diagnosis not present

## 2023-01-21 DIAGNOSIS — Z125 Encounter for screening for malignant neoplasm of prostate: Secondary | ICD-10-CM | POA: Diagnosis not present

## 2023-01-21 DIAGNOSIS — M79641 Pain in right hand: Secondary | ICD-10-CM | POA: Diagnosis not present

## 2023-01-21 DIAGNOSIS — R7309 Other abnormal glucose: Secondary | ICD-10-CM | POA: Insufficient documentation

## 2023-01-21 DIAGNOSIS — H9190 Unspecified hearing loss, unspecified ear: Secondary | ICD-10-CM

## 2023-01-21 DIAGNOSIS — E1169 Type 2 diabetes mellitus with other specified complication: Secondary | ICD-10-CM

## 2023-01-21 DIAGNOSIS — E538 Deficiency of other specified B group vitamins: Secondary | ICD-10-CM | POA: Diagnosis not present

## 2023-01-21 LAB — COMPREHENSIVE METABOLIC PANEL
ALT: 13 U/L (ref 0–53)
AST: 15 U/L (ref 0–37)
Albumin: 4.3 g/dL (ref 3.5–5.2)
Alkaline Phosphatase: 108 U/L (ref 39–117)
BUN: 11 mg/dL (ref 6–23)
CO2: 25 meq/L (ref 19–32)
Calcium: 9.4 mg/dL (ref 8.4–10.5)
Chloride: 103 meq/L (ref 96–112)
Creatinine, Ser: 0.93 mg/dL (ref 0.40–1.50)
GFR: 89.18 mL/min (ref >60.00)
Glucose, Bld: 111 mg/dL — ABNORMAL HIGH (ref 70–99)
Potassium: 4.7 meq/L (ref 3.5–5.1)
Sodium: 138 meq/L (ref 135–145)
Total Bilirubin: 0.6 mg/dL (ref 0.2–1.2)
Total Protein: 7 g/dL (ref 6.0–8.3)

## 2023-01-21 LAB — LIPID PANEL
Cholesterol: 224 mg/dL — ABNORMAL HIGH (ref 0–200)
HDL: 47.4 mg/dL (ref >39.00)
LDL Cholesterol: 152 mg/dL — ABNORMAL HIGH (ref 0–99)
NonHDL: 176.79
Total CHOL/HDL Ratio: 5
Triglycerides: 124 mg/dL (ref 0.0–149.0)
VLDL: 24.8 mg/dL (ref 0.0–40.0)

## 2023-01-21 LAB — VITAMIN B12: Vitamin B-12: 443 pg/mL (ref 211–911)

## 2023-01-21 LAB — CBC WITH DIFFERENTIAL/PLATELET
Basophils Absolute: 0.1 10*3/uL (ref 0.0–0.1)
Basophils Relative: 1.2 % (ref 0.0–3.0)
Eosinophils Absolute: 0.4 10*3/uL (ref 0.0–0.7)
Eosinophils Relative: 5.6 % — ABNORMAL HIGH (ref 0.0–5.0)
HCT: 41.8 % (ref 39.0–52.0)
Hemoglobin: 13.8 g/dL (ref 13.0–17.0)
Lymphocytes Relative: 17.4 % (ref 12.0–46.0)
Lymphs Abs: 1.2 10*3/uL (ref 0.7–4.0)
MCHC: 33 g/dL (ref 30.0–36.0)
MCV: 91.9 fL (ref 78.0–100.0)
Monocytes Absolute: 0.6 10*3/uL (ref 0.1–1.0)
Monocytes Relative: 8.7 % (ref 3.0–12.0)
Neutro Abs: 4.5 10*3/uL (ref 1.4–7.7)
Neutrophils Relative %: 67.1 % (ref 43.0–77.0)
Platelets: 354 10*3/uL (ref 150.0–400.0)
RBC: 4.55 Mil/uL (ref 4.22–5.81)
RDW: 13.5 % (ref 11.5–15.5)
WBC: 6.7 10*3/uL (ref 4.0–10.5)

## 2023-01-21 LAB — SEDIMENTATION RATE: Sed Rate: 47 mm/h — ABNORMAL HIGH (ref 0–20)

## 2023-01-21 LAB — C-REACTIVE PROTEIN: CRP: 2.1 mg/dL (ref 0.5–20.0)

## 2023-01-21 LAB — MICROALBUMIN / CREATININE URINE RATIO
Creatinine,U: 56.2 mg/dL
Microalb Creat Ratio: 1.2 mg/g (ref 0.0–30.0)
Microalb, Ur: <0.7 mg/dL (ref 0.0–1.9)

## 2023-01-21 LAB — HEMOGLOBIN A1C: Hgb A1c MFr Bld: 7.1 % — ABNORMAL HIGH (ref 4.6–6.5)

## 2023-01-21 LAB — PSA: PSA: 0.63 ng/mL (ref 0.10–4.00)

## 2023-01-21 LAB — URIC ACID: Uric Acid, Serum: 6.7 mg/dL (ref 4.0–7.8)

## 2023-01-21 LAB — VITAMIN D 25 HYDROXY (VIT D DEFICIENCY, FRACTURES): VITD: 30.13 ng/mL (ref 30.00–100.00)

## 2023-01-21 MED ORDER — PREDNISONE 20 MG PO TABS
40.0000 mg | ORAL_TABLET | Freq: Every day | ORAL | 0 refills | Status: AC
Start: 2023-01-21 — End: 2023-01-26

## 2023-01-21 NOTE — Patient Instructions (Addendum)
It was a pleasure meeting you today. Thank you for allowing me to take part in your health care.  Our goals for today as we discussed include:  Xray today Start Prednisone 40 mg daily for 5 days Monitor glucose at home, if > 300 notify MD Elevate arm above heart level Avoid repetitive motion Ultrasound ordered  We will get some labs today.  If they are abnormal or we need to do something about them, I will call you.  If they are normal, I will send you a message on MyChart (if it is active) or a letter in the mail.  If you don't hear from [place] in 2 weeks, please call the office at the number below.    Follow up as needed or if symptoms worsen  If you have any questions or concerns, please do not hesitate to call the office at [phone].  I look forward to our next visit and until then take care and stay safe.  Regards,   Dana Allan, MD   [facility]

## 2023-01-21 NOTE — Progress Notes (Signed)
SUBJECTIVE:   Chief Complaint  Patient presents with   Establish Care   HPI Patient presents to clinic to establish care  Discussed the use of AI scribe software for clinical note transcription with the patient, who gave verbal consent to proceed.  History of Present Illness   The patient, a 62 year old with a history of gout, arthritis, and previously diagnosed diabetes, presents with a chief complaint of increased pain and flare-ups associated with his gout and arthritis. The patient attributes this exacerbation to a recent two-week period without his usual intake of homemade kefir, a fermented milk drink, which he believes has been effective in managing his symptoms. He reports that during the time he was consuming kefir regularly, he was able to consume foods typically associated with gout flare-ups, such as steak, shrimp, and chicken livers, without experiencing any issues. The patient has a strong preference for natural remedies and expresses a reluctance to take pharmaceutical medications, including allopurinol or colchicine, which are typically used to manage gout.  The patient also reports a significant issue with his right hand and wrist, which has been swollen and painful for the past two weeks. 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 The patient also mentions a previous diagnosis of diabetes, which he managed to reverse through dietary changes and weight loss. However, he expresses concern about recent weight gain and the potential for his diabetes to return.  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 He expresses a desire to schedule this procedure in the next three months. The patient also takes a variety of over-the-counter supplements, including apple  cider vinegar, B complex vitamins, coenzyme Q10, vitamin B12, magnesium, omega-3 fatty acids, turmeric, and an over-the-counter testosterone booster, which he believes helps him sleep. He also consumes extra virgin olive oil regularly. The patient denies any current use of prescription medications.     General Health Maintenance -Order blood work to check cholesterol, liver function, and kidney function. -Consider ordering shingles vaccine.       PERTINENT PMH / PSH: As above  OBJECTIVE:  BP 126/78   Pulse 66   Temp 97.9 F (36.6 C)   Resp 16   Ht 5\' 8"  (1.727 m)   Wt 204 lb (92.5 kg)   SpO2 95%   BMI 31.02 kg/m    Physical Exam Vitals reviewed.  Constitutional:      General: He is not in acute distress.    Appearance: He is obese. He is not ill-appearing.  HENT:     Right Ear: Tympanic membrane, ear canal and external ear normal.     Left Ear: Tympanic membrane, ear canal and external ear normal.     Mouth/Throat:     Mouth: Mucous membranes are moist.  Eyes:     Conjunctiva/sclera: Conjunctivae normal.  Neck:     Thyroid: No thyromegaly or thyroid tenderness.  Cardiovascular:     Rate and Rhythm: Normal rate and regular rhythm.     Pulses: Normal pulses.     Heart sounds: Normal heart sounds.  Pulmonary:     Effort: Pulmonary effort is normal.  Abdominal:     General: Bowel sounds are normal.  Lymphadenopathy:     Cervical: No cervical adenopathy.  Neurological:     General: No focal  deficit present.     Mental Status: He is alert and oriented to person, place, and time. Mental status is at baseline.  Psychiatric:        Mood and Affect: Mood normal.        Behavior: Behavior normal.        Thought Content: Thought content normal.   MUSCULOSKELETAL: Right elbow exhibits slight pain upon movement. Right hand demonstrates significant swelling, limited range of motion, and decreased grip strength.     01/21/2023    9:14 AM 11/25/2020    8:28 AM 12/04/2019    10:59 AM 08/19/2017    2:17 PM 08/08/2015   12:10 PM  Depression screen PHQ 2/9  Decreased Interest 0 0 0 0 0  Down, Depressed, Hopeless 0 0 0 0 0  PHQ - 2 Score 0 0 0 0 0  Altered sleeping 2  0    Tired, decreased energy 2  0    Change in appetite 0  0    Feeling bad or failure about yourself  0  0    Trouble concentrating 0  0    Moving slowly or fidgety/restless 0  0    Suicidal thoughts 0  0    PHQ-9 Score 4  0    Difficult doing work/chores Somewhat difficult  Not difficult at all        01/21/2023    9:15 AM 12/04/2019   10:59 AM  GAD 7 : Generalized Anxiety Score  Nervous, Anxious, on Edge 0 0  Control/stop worrying 0 0  Worry too much - different things 0 0  Trouble relaxing 2 0  Restless 0 0  Easily annoyed or irritable 0 0  Afraid - awful might happen 0 0  Total GAD 7 Score 2 0  Anxiety Difficulty Somewhat difficult Not difficult at all    ASSESSMENT/PLAN:  Colon cancer screening Assessment & Plan: Overdue for colonoscopy. Patient has scheduling difficulties due to work. -Attempt to schedule colonoscopy within the next three months. -If unable to schedule colonoscopy, consider Cologuard as an alternative.  Orders: -     Ambulatory referral to Gastroenterology  Edema of upper extremity Assessment & Plan: Bilateral upper extremity edema and worsening pain Right > Left Possible flare gout/arthritis. -Order x-ray and ultrasound of hand and arm due to significant swelling and limited mobility. -Follow up with Orthopedics for left wrist pain  Orders: -     CBC with Differential/Platelet -     Sedimentation rate -     C-reactive protein -     Rheumatoid factor -     DG Hand Complete Right; Future -     [place] UPPER EXTREMITY DUPLEX RIGHT (NON-WBI); Future -     predniSONE; Take 2 tablets (40 mg total) by mouth daily with breakfast for 5 days.  Dispense: 10 tablet; Refill: 0  Gout, unspecified cause, unspecified chronicity, unspecified site Assessment &  Plan: Flare up in the context of stopping kefir intake. Patient prefers natural remedies over pharmaceuticals. -Order blood work to check uric acid levels and rule out infection. -Short course of Prednisone 40mg  to manage current flare up. -Advise patient to monitor blood sugar levels more frequently while on Prednisone due to history of diabetes.  Orders: -     Uric acid  Controlled type 2 diabetes mellitus with complication, without long-term current use of insulin (HCC) -     Hemoglobin A1c -     Comprehensive metabolic panel -  Microalbumin / creatinine urine ratio  Hyperlipidemia associated with type 2 diabetes mellitus (HCC) Assessment & Plan: Not on statin and not interested in starting medication Check fasting lipids  Orders: -     Lipid panel  Vitamin D deficiency -     VITAMIN D 25 Hydroxy (Vit-D Deficiency, Fractures)  Vitamin B 12 deficiency -     Vitamin B12  Prostate cancer screening -     PSA  OSA on CPAP Assessment & Plan: Patient uses CPAP machine inconsistently. -Encourage consistent use of CPAP machine.   Hard of hearing Assessment & Plan: Patient reports losing a piece of hearing aid. -Advise patient to follow up with [facility] where hearing aids were purchased.    PDMP reviewed  Return if symptoms worsen or fail to improve, for PCP.  Dana Allan, MD

## 2023-01-22 LAB — RHEUMATOID FACTOR: Rheumatoid fact SerPl-aCnc: <10 [IU]/mL (ref <14)

## 2023-01-27 ENCOUNTER — Encounter: Payer: Self-pay | Admitting: Family Medicine

## 2023-01-27 DIAGNOSIS — E118 Type 2 diabetes mellitus with unspecified complications: Secondary | ICD-10-CM | POA: Insufficient documentation

## 2023-01-27 DIAGNOSIS — H9190 Unspecified hearing loss, unspecified ear: Secondary | ICD-10-CM | POA: Insufficient documentation

## 2023-01-27 NOTE — Assessment & Plan Note (Signed)
Patient uses CPAP machine inconsistently. -Encourage consistent use of CPAP machine.

## 2023-01-27 NOTE — Assessment & Plan Note (Signed)
Not on statin and not interested in starting medication Check fasting lipids

## 2023-01-27 NOTE — Assessment & Plan Note (Signed)
Overdue for colonoscopy. Patient has scheduling difficulties due to work. -Attempt to schedule colonoscopy within the next three months. -If unable to schedule colonoscopy, consider Cologuard as an alternative.

## 2023-01-27 NOTE — Assessment & Plan Note (Signed)
History of diabetes, currently well-controlled without medication. -Continue current management. -Advise patient to monitor blood sugar levels more frequently while on Prednisone.

## 2023-01-27 NOTE — Assessment & Plan Note (Signed)
Flare up in the context of stopping kefir intake. Patient prefers natural remedies over pharmaceuticals. -Order blood work to check uric acid levels and rule out infection. -Short course of Prednisone 40mg  to manage current flare up. -Advise patient to monitor blood sugar levels more frequently while on Prednisone due to history of diabetes.

## 2023-01-27 NOTE — Assessment & Plan Note (Addendum)
Bilateral upper extremity edema and worsening pain Right > Left Possible flare gout/arthritis. -Order x-ray and ultrasound of hand and arm due to significant swelling and limited mobility. -Follow up with Orthopedics for left wrist pain

## 2023-01-27 NOTE — Assessment & Plan Note (Signed)
Patient reports losing a piece of hearing aid. -Advise patient to follow up with [facility] where hearing aids were purchased.

## 2023-01-29 ENCOUNTER — Telehealth: Payer: Self-pay

## 2023-01-29 NOTE — Telephone Encounter (Signed)
Patient called back to schedule his colonoscopy.

## 2023-01-29 NOTE — Telephone Encounter (Signed)
Message left for patient to return my call.  

## 2023-01-30 ENCOUNTER — Telehealth: Payer: Self-pay | Admitting: *Deleted

## 2023-01-30 ENCOUNTER — Other Ambulatory Visit: Payer: Self-pay | Admitting: *Deleted

## 2023-01-30 DIAGNOSIS — Z860100 Personal history of colon polyps, unspecified: Secondary | ICD-10-CM

## 2023-01-30 MED ORDER — NA SULFATE-K SULFATE-MG SULF 17.5-3.13-1.6 GM/177ML PO SOLN: 1.0000 | Freq: Once | ORAL | 0 refills | Status: AC

## 2023-01-30 NOTE — Telephone Encounter (Signed)
Colonoscopy schedule on 03/11/2023 With Dr Allegra Lai at [facility]

## 2023-01-30 NOTE — Telephone Encounter (Signed)
Gastroenterology Pre-Procedure Review  Request Date: 03/11/2023 Requesting Physician: Dr. Allegra Lai  PATIENT REVIEW QUESTIONS: The patient responded to the following health history questions as indicated:    1. Are you having any GI issues? no 2. Do you have a personal history of Polyps? yes (05/07/2014) 3. Do you have a family history of Colon Cancer or Polyps? no 4. Diabetes Mellitus? no 5. Joint replacements in the past 12 months?no 6. Major health problems in the past 3 months?no 7. Any artificial heart valves, MVP, or defibrillator?no    MEDICATIONS & ALLERGIES:    Patient reports the following regarding taking any anticoagulation/antiplatelet therapy:   Plavix, Coumadin, Eliquis, Xarelto, Lovenox, Pradaxa, Brilinta, or Effient? no Aspirin? no  Patient confirms/reports the following medications:  Current Outpatient Medications  Medication Sig Dispense Refill   Apple Cider Vinegar 500 MG TABS      B Complex-C-Folic Acid (SUPER B COMPLEX/FA/VIT C) TABS      Coenzyme Q10 (COQ10 PO) Take by mouth daily.     cyanocobalamin 2000 MCG tablet Take 2,000 mcg by mouth daily.     Garlic 1000 MG CAPS      glucose blood (ONE TOUCH TEST STRIPS) test strip Use as instructed to test blood sugar once daily E11.9 RELION PRIME STRIPS 100 each 12   Magnesium 250 MG TABS      Multiple Vitamin (MULTIVITAMINS PO) Take by mouth daily.     Omega 3 1000 MG CAPS      ReliOn Ultra Thin Lancets MISC 1 Device by Does not apply route daily. Lancets approved with machine has relion machine 100 each 3   Testosterone 100 MG PLLT      TURMERIC PO Take by mouth.     UNABLE TO FIND Med Name: extra virgin olive oil 1 tablespoon     No current facility-administered medications for this visit.    Patient confirms/reports the following allergies:  No Known Allergies  No orders of the defined types were placed in this encounter.   AUTHORIZATION INFORMATION Primary Insurance: 1D#: Group #:  Secondary  Insurance: 1D#: Group #:  SCHEDULE INFORMATION: Date: 03/11/2023 Time: Location:  [facility]

## 2023-02-04 ENCOUNTER — Other Ambulatory Visit: Payer: Self-pay | Admitting: Family Medicine

## 2023-02-04 ENCOUNTER — Telehealth: Payer: Self-pay | Admitting: Family Medicine

## 2023-02-04 DIAGNOSIS — E119 Type 2 diabetes mellitus without complications: Secondary | ICD-10-CM

## 2023-02-04 MED ORDER — METFORMIN HCL ER 500 MG PO TB24: 500.0000 mg | ORAL_TABLET | Freq: Every day | ORAL | 3 refills | Status: DC

## 2023-02-04 NOTE — Telephone Encounter (Signed)
Patient states provider wanted him back on metformin but no script has not been called in. He would like it sent to  [facility] on [address].

## 2023-02-05 NOTE — Telephone Encounter (Signed)
Called and informed pt that rx was sent to the pharmacy

## 2023-02-17 ENCOUNTER — Encounter: Payer: Self-pay | Admitting: Family Medicine

## 2023-02-18 ENCOUNTER — Telehealth: Payer: Self-pay | Admitting: *Deleted

## 2023-02-18 NOTE — Telephone Encounter (Signed)
Patient called and left a voicemail wanting to schedule appointment I called patient back to let her know we received her message. The patient wife stated she already spoke with the scheduler.

## 2023-02-18 NOTE — Telephone Encounter (Signed)
Patient's wife called office and stating that patient schedule his colonoscopy on a Monday that he is not here. Patient is a Naval architect.  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  Called endo unit to make the change. They have already pick up the prep solution.   New instructions will be sent.

## 2023-03-04 ENCOUNTER — Ambulatory Visit: Payer: BC Managed Care – PPO | Admitting: Family Medicine

## 2023-03-04 ENCOUNTER — Encounter: Payer: Self-pay | Admitting: Family Medicine

## 2023-03-04 VITALS — BP 126/82 | HR 61 | Temp 98.0°F | Resp 16 | Ht 68.0 in | Wt 194.2 lb

## 2023-03-04 DIAGNOSIS — Z7984 Long term (current) use of oral hypoglycemic drugs: Secondary | ICD-10-CM

## 2023-03-04 DIAGNOSIS — E1169 Type 2 diabetes mellitus with other specified complication: Secondary | ICD-10-CM | POA: Diagnosis not present

## 2023-03-04 DIAGNOSIS — R7989 Other specified abnormal findings of blood chemistry: Secondary | ICD-10-CM | POA: Diagnosis not present

## 2023-03-04 DIAGNOSIS — E118 Type 2 diabetes mellitus with unspecified complications: Secondary | ICD-10-CM | POA: Diagnosis not present

## 2023-03-04 DIAGNOSIS — M2550 Pain in unspecified joint: Secondary | ICD-10-CM | POA: Diagnosis not present

## 2023-03-04 DIAGNOSIS — E785 Hyperlipidemia, unspecified: Secondary | ICD-10-CM

## 2023-03-04 DIAGNOSIS — E559 Vitamin D deficiency, unspecified: Secondary | ICD-10-CM

## 2023-03-04 MED ORDER — ROSUVASTATIN CALCIUM 10 MG PO TABS: 10.0000 mg | ORAL_TABLET | Freq: Every evening | ORAL | 3 refills | Status: DC

## 2023-03-04 MED ORDER — ROSUVASTATIN CALCIUM 10 MG PO TABS: 10.0000 mg | ORAL_TABLET | Freq: Every day | ORAL | 3 refills | Status: DC

## 2023-03-04 MED ORDER — VITAMIN D (ERGOCALCIFEROL) 1.25 MG (50000 UNIT) PO CAPS: 50000.0000 [IU] | ORAL_CAPSULE | ORAL | 1 refills | Status: DC

## 2023-03-04 MED ORDER — NAPROXEN SODIUM 550 MG PO TABS
550.0000 mg | ORAL_TABLET | Freq: Two times a day (BID) | ORAL | 0 refills | Status: AC
Start: 2023-03-04 — End: 2023-03-18

## 2023-03-04 MED ORDER — PANTOPRAZOLE SODIUM 40 MG PO TBEC: 40.0000 mg | DELAYED_RELEASE_TABLET | Freq: Every day | ORAL | 0 refills | Status: DC

## 2023-03-04 NOTE — Progress Notes (Signed)
SUBJECTIVE:   Chief Complaint  Patient presents with   Joint Swelling   HPI Presents to discuss chronic bilateral hand and wrist pain and swelling  Discussed the use of AI scribe software for clinical note transcription with the patient, who gave verbal consent to proceed.  History of Present Illness The patient, a known diabetic, presents with a chief complaint of persistent and widespread body pain, primarily localized to the hands. The pain, described as moving throughout the body, has been ongoing for several months, with the patient noting difficulty in performing daily tasks such as opening jars due to muscle weakness and pain. The patient has been self-managing the pain with ibuprofen, taken two to three times a day on an as-needed basis, and has also been wearing a brace during sleep to prevent further injury.  The patient has a history of gout, but recent lab results showed normal uric acid levels, making a gout flare-up unlikely. 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 However, the patient's pain and swelling have not significantly improved despite a course of steroids, leading to uncertainty about the diagnosis.  The patient has been managing his diabetes with metformin, recently restarted at a dose of 500mg  per day. Recent lab results showed an A1c of 7.1, indicating a need for better diabetes control. The patient's cholesterol levels were also found to be high, with a particular concern for high bad cholesterol levels due to the patient's diabetes.  The patient also reported a history of toenail fungus, for which he had been taking high doses of garlic, though he noted no significant improvement. The patient expressed concern about the potential liver effects of medications due to having only one liver.  In summary, the patient is a known diabetic presenting with persistent and  widespread body pain, primarily in the hands, with an unclear etiology. The patient's pain has been resistant to self-management strategies and a course of steroids. The patient's diabetes and cholesterol levels are also of concern, requiring further management.    PERTINENT PMH / PSH: As above  OBJECTIVE:  BP 126/82   Pulse 61   Temp 98 F (36.7 C)   Resp 16   Ht 5\' 8"  (1.727 m)   Wt 194 lb 4 oz (88.1 kg)   SpO2 95%   BMI 29.54 kg/m    Physical Exam Vitals reviewed.  Constitutional:      General: He is not in acute distress.    Appearance: Normal appearance. He is normal weight. He is not ill-appearing, toxic-appearing or diaphoretic.  Eyes:     General:        Right eye: No discharge.        Left eye: No discharge.  Cardiovascular:     Rate and Rhythm: Normal rate and regular rhythm.     Heart sounds: Normal heart sounds.  Pulmonary:     Effort: Pulmonary effort is normal.     Breath sounds: Normal breath sounds.  Abdominal:     General: Bowel sounds are normal.  Musculoskeletal:     Right wrist: Swelling and tenderness present. Decreased range of motion.     Cervical back: Normal range of motion.  Skin:    General: Skin is warm and dry.  Neurological:     Mental Status: He is alert and oriented to person, place, and time. Mental status is at baseline.  Psychiatric:  Mood and Affect: Mood normal.        Behavior: Behavior normal.        Thought Content: Thought content normal.        Judgment: Judgment normal.        03/04/2023    2:40 PM 01/21/2023    9:14 AM 11/25/2020    8:28 AM 12/04/2019   10:59 AM 08/19/2017    2:17 PM  Depression screen PHQ 2/9  Decreased Interest 0 0 0 0 0  Down, Depressed, Hopeless 0 0 0 0 0  PHQ - 2 Score 0 0 0 0 0  Altered sleeping 1 2  0   Tired, decreased energy 1 2  0   Change in appetite 0 0  0   Feeling bad or failure about yourself  0 0  0   Trouble concentrating 0 0  0   Moving slowly or fidgety/restless 0 0  0    Suicidal thoughts 0 0  0   PHQ-9 Score 2 4  0   Difficult doing work/chores Not difficult at all Somewhat difficult  Not difficult at all       03/04/2023    2:40 PM 01/21/2023    9:15 AM 12/04/2019   10:59 AM  GAD 7 : Generalized Anxiety Score  Nervous, Anxious, on Edge 0 0 0  Control/stop worrying 0 0 0  Worry too much - different things 0 0 0  Trouble relaxing 1 2 0  Restless 0 0 0  Easily annoyed or irritable 0 0 0  Afraid - awful might happen 0 0 0  Total GAD 7 Score 1 2 0  Anxiety Difficulty Not difficult at all Somewhat difficult Not difficult at all    ASSESSMENT/PLAN:  Polyarthralgia Assessment & Plan: Diffuse joint pain, predominantly in the hands, with associated swelling. Normal uric acid level. Negative rheumatoid factor. X-ray showed primary osteoarthritis. Inflammatory markers slightly elevated. Unclear etiology, possibly rheumatological. -Consider referral to Rheumatology for further evaluation. -Trial of low-dose Celebrex for symptomatic relief, with concurrent stomach protection. -Continue use of joint braces, heat, and ice as tolerated. -Ultrasound from previous visit pending  Orders: -     Ambulatory referral to Rheumatology -     Naproxen Sodium; Take 1 tablet (550 mg total) by mouth 2 (two) times daily with a meal for 14 days.  Dispense: 28 tablet; Refill: 0 -     Pantoprazole Sodium; Take 1 tablet (40 mg total) by mouth daily.  Dispense: 30 tablet; Refill: 0  Hyperlipidemia associated with type 2 diabetes mellitus (HCC) Assessment & Plan: Elevated total cholesterol and LDL, with high 10-year cardiovascular risk due to diabetes. -Initiate Crestor to reduce LDL and overall cardiovascular risk. -Monitor for potential side effects, including increased blood glucose and muscle aches/pains.  Orders: -     Rosuvastatin Calcium; Take 1 tablet (10 mg total) by mouth every evening.  Dispense: 90 tablet; Refill: 3  Low vitamin D level -     Vitamin D  (Ergocalciferol); Take 1 capsule (50,000 Units total) by mouth every 7 (seven) days.  Dispense: 12 capsule; Refill: 1  Type 2 diabetes mellitus with complications (HCC) Assessment & Plan: A1c of 7.1, indicating suboptimal glycemic control. -Refill Metformin 500mg  daily. -Recheck A1c in 3 months.   Vitamin D deficiency Assessment & Plan: Low-normal Vitamin D level. -Initiate Vitamin D supplementation, consider higher dose for 6 months followed by maintenance dose.     PDMP reviewed  Return if symptoms worsen or  fail to improve, for PCP.  Dana Allan, MD

## 2023-03-04 NOTE — Patient Instructions (Addendum)
It was a pleasure meeting you today. Thank you for allowing me to take part in your health care.  Our goals for today as we discussed include:  Start Naproxen 1 tablet 2 times a day Start Protonix 40 mg daily  Referral sent to Rheumatology  Start Crestor 10 mg daily  Follow up if no improvement  Foot exam at next visit Recommend Annual Eye exam   This is a list of the screening recommended for you and due dates:  Health Maintenance  Topic Date Due   Zoster (Shingles) Vaccine (1 of 2) Never done   Complete foot exam   08/14/2016   Colon Cancer Screening  05/08/2019   DTaP/Tdap/Td vaccine (2 - Tdap) 09/01/2019   Eye exam for diabetics  01/17/2021   Flu Shot  07/22/2023*   COVID-19 Vaccine (1 - 2023-24 season) 02/12/2024*   Hemoglobin A1C  07/21/2023   Yearly kidney function blood test for diabetes  01/21/2024   Yearly kidney health urinalysis for diabetes  01/21/2024   Hepatitis C Screening  Completed   HIV Screening  Completed   HPV Vaccine  Aged Out  *Topic was postponed. The date shown is not the original due date.      If you have any questions or concerns, please do not hesitate to call the office at [phone].  I look forward to our next visit and until then take care and stay safe.  Regards,   Dana Allan, MD   [facility]

## 2023-03-07 ENCOUNTER — Encounter: Payer: Self-pay | Admitting: Family Medicine

## 2023-03-07 DIAGNOSIS — M2550 Pain in unspecified joint: Secondary | ICD-10-CM | POA: Insufficient documentation

## 2023-03-07 DIAGNOSIS — R7989 Other specified abnormal findings of blood chemistry: Secondary | ICD-10-CM | POA: Insufficient documentation

## 2023-03-07 NOTE — Assessment & Plan Note (Addendum)
A1c of 7.1, indicating suboptimal glycemic control. -Refill Metformin 500mg  daily. -Recheck A1c in 3 months.

## 2023-03-07 NOTE — Assessment & Plan Note (Signed)
Elevated total cholesterol and LDL, with high 10-year cardiovascular risk due to diabetes. -Initiate Crestor to reduce LDL and overall cardiovascular risk. -Monitor for potential side effects, including increased blood glucose and muscle aches/pains.

## 2023-03-07 NOTE — Assessment & Plan Note (Signed)
Low-normal Vitamin D level. -Initiate Vitamin D supplementation, consider higher dose for 6 months followed by maintenance dose.

## 2023-03-07 NOTE — Assessment & Plan Note (Addendum)
Diffuse joint pain, predominantly in the hands, with associated swelling. Normal uric acid level. Negative rheumatoid factor. X-ray showed primary osteoarthritis. Inflammatory markers slightly elevated. Unclear etiology, possibly rheumatological. -Consider referral to Rheumatology for further evaluation. -Trial of low-dose Celebrex for symptomatic relief, with concurrent stomach protection. -Continue use of joint braces, heat, and ice as tolerated. -Ultrasound from previous visit pending

## 2023-03-14 ENCOUNTER — Telehealth: Payer: Self-pay | Admitting: Family Medicine

## 2023-03-14 NOTE — Telephone Encounter (Signed)
Morrie Sheldon from imaging at [facility] needs clarification on referral. Please clarify if Dr Clent Ridges is looking for a blood clot or soft tissue. Please call main number at [phone].

## 2023-03-15 NOTE — Telephone Encounter (Signed)
Called and let them know and they said they are going to send the order back to you.

## 2023-03-18 ENCOUNTER — Ambulatory Visit
Admission: RE | Admit: 2023-03-18 | Discharge: 2023-03-18 | Disposition: A | Discharge status: Home / Self Care | Payer: BC Managed Care – PPO | Source: Ambulatory Visit | Attending: Family Medicine | Admitting: Family Medicine

## 2023-03-18 DIAGNOSIS — R600 Localized edema: Secondary | ICD-10-CM | POA: Diagnosis not present

## 2023-03-18 DIAGNOSIS — M79601 Pain in right arm: Secondary | ICD-10-CM | POA: Diagnosis not present

## 2023-03-22 ENCOUNTER — Encounter: Payer: Self-pay | Admitting: Family Medicine

## 2023-03-29 ENCOUNTER — Encounter: Payer: Self-pay | Admitting: Gastroenterology

## 2023-04-01 ENCOUNTER — Encounter
Admission: RE | Disposition: A | Discharge status: Home / Self Care | Payer: Self-pay | Source: Home / Self Care | Attending: Gastroenterology

## 2023-04-01 ENCOUNTER — Ambulatory Visit
Admission: RE | Admit: 2023-04-01 | Discharge: 2023-04-01 | Disposition: A | Discharge status: Home / Self Care | Payer: BC Managed Care – PPO | Attending: Gastroenterology | Admitting: Gastroenterology

## 2023-04-01 ENCOUNTER — Ambulatory Visit: Payer: BC Managed Care – PPO | Admitting: Anesthesiology

## 2023-04-01 ENCOUNTER — Encounter: Payer: Self-pay | Admitting: Gastroenterology

## 2023-04-01 DIAGNOSIS — K644 Residual hemorrhoidal skin tags: Secondary | ICD-10-CM | POA: Diagnosis not present

## 2023-04-01 DIAGNOSIS — G4730 Sleep apnea, unspecified: Secondary | ICD-10-CM | POA: Insufficient documentation

## 2023-04-01 DIAGNOSIS — K648 Other hemorrhoids: Secondary | ICD-10-CM | POA: Diagnosis not present

## 2023-04-01 DIAGNOSIS — Z860100 Personal history of colon polyps, unspecified: Secondary | ICD-10-CM | POA: Diagnosis not present

## 2023-04-01 DIAGNOSIS — K5730 Diverticulosis of large intestine without perforation or abscess without bleeding: Secondary | ICD-10-CM | POA: Diagnosis not present

## 2023-04-01 DIAGNOSIS — Z1211 Encounter for screening for malignant neoplasm of colon: Secondary | ICD-10-CM | POA: Insufficient documentation

## 2023-04-01 HISTORY — PX: COLONOSCOPY WITH PROPOFOL: SHX5780

## 2023-04-01 LAB — GLUCOSE, CAPILLARY: Glucose-Capillary: 88 mg/dL (ref 70–99)

## 2023-04-01 SURGERY — COLONOSCOPY WITH PROPOFOL
Anesthesia: General

## 2023-04-01 MED ORDER — STERILE WATER FOR IRRIGATION IR SOLN
Status: DC | PRN
  Administered 2023-04-01: 50 mL

## 2023-04-01 MED ORDER — PROPOFOL 500 MG/50ML IV EMUL
INTRAVENOUS | Status: DC | PRN
  Administered 2023-04-01: 200 ug/kg/min via INTRAVENOUS
  Administered 2023-04-01: 100 mg via INTRAVENOUS

## 2023-04-01 MED ORDER — SODIUM CHLORIDE 0.9 % IV SOLN
INTRAVENOUS | Status: DC
  Administered 2023-04-01: 20 mL/h via INTRAVENOUS

## 2023-04-01 NOTE — Op Note (Signed)
[facility] Gastroenterology Patient Name: Paul Goodwin Procedure Date: 04/01/2023 10:31 AM MRN: [id number] Account #: [id number] Date of Birth: 08/07/1960 Admit Type: Outpatient Age: 62 Room: [facility] ENDO ROOM 4 Gender: Male Note Status: Finalized Instrument Name: Colonoscope [id number] Procedure:             Colonoscopy Indications:           Screening for colorectal malignant neoplasm Providers:             Toney Reil MD, MD Referring MD:          Dana Allan (Referring MD) Medicines:             General Anesthesia Complications:         No immediate complications. Estimated blood loss: None. Procedure:             Pre-Anesthesia Assessment:                        - Prior to the procedure, a History and Physical was                         performed, and patient medications and allergies were                         reviewed. The patient is competent. The risks and                         benefits of the procedure and the sedation options and                         risks were discussed with the patient. All questions                         were answered and informed consent was obtained.                         Patient identification and proposed procedure were                         verified by the physician, the nurse, the                         anesthesiologist, the anesthetist and the technician                         in the pre-procedure area in the procedure room in the                         endoscopy suite. Mental Status Examination: alert and                         oriented. Airway Examination: normal oropharyngeal                         airway and neck mobility. Respiratory Examination:                         clear to auscultation. CV Examination: normal.  Prophylactic Antibiotics: The patient does not require                         prophylactic antibiotics. Prior Anticoagulants: The                          patient has taken no anticoagulant or antiplatelet                         agents. ASA Grade Assessment: II - A patient with mild                         systemic disease. After reviewing the risks and                         benefits, the patient was deemed in satisfactory                         condition to undergo the procedure. The anesthesia                         plan was to use general anesthesia. Immediately prior                         to administration of medications, the patient was                         re-assessed for adequacy to receive sedatives. The                         heart rate, respiratory rate, oxygen saturations,                         blood pressure, adequacy of pulmonary ventilation, and                         response to care were monitored throughout the                         procedure. The physical status of the patient was                         re-assessed after the procedure.                        After obtaining informed consent, the colonoscope was                         passed under direct vision. Throughout the procedure,                         the patient's blood pressure, pulse, and oxygen                         saturations were monitored continuously. The                         Colonoscope was introduced through the anus and  advanced to the the cecum, identified by appendiceal                         orifice and ileocecal valve. The colonoscopy was                         performed without difficulty. The patient tolerated                         the procedure well. The quality of the bowel                         preparation was evaluated using the BBPS [facility] Bowel                         Preparation Scale) with scores of: Right Colon = 3,                         Transverse Colon = 3 and Left Colon = 3 (entire mucosa                         seen well with no residual staining, small fragments                          of stool or opaque liquid). The total BBPS score                         equals 9. The ileocecal valve, appendiceal orifice,                         and rectum were photographed. Findings:      The perianal and digital rectal examinations were normal. Pertinent       negatives include normal sphincter tone and no palpable rectal lesions.      Multiple large-mouthed diverticula were found in the entire colon.      Non-bleeding external hemorrhoids were found during retroflexion. The       hemorrhoids were medium-sized. Impression:            - Diverticulosis in the entire examined colon.                        - Non-bleeding external hemorrhoids.                        - No specimens collected. Recommendation:        - Repeat colonoscopy in 10 years for screening                         purposes.                        - Discharge patient to home (with escort).                        - Resume previous diet today.                        - Continue present medications. Procedure Code(s):     --- Professional ---  S0109, Colorectal cancer screening; colonoscopy on                         individual not meeting criteria for high risk Diagnosis Code(s):     --- Professional ---                        Z12.11, Encounter for screening for malignant neoplasm                         of colon                        K64.4, Residual hemorrhoidal skin tags                        K57.30, Diverticulosis of large intestine without                         perforation or abscess without bleeding CPT copyright 2022 American Medical Association. All rights reserved. The codes documented in this report are preliminary and upon coder review may  be revised to meet current compliance requirements. Dr. Libby Maw Toney Reil MD, MD 04/01/2023 11:49:17 AM This report has been signed electronically. Number of Addenda: 0 Note Initiated On: 04/01/2023 10:31 AM Scope Withdrawal  Time: 0 hours 10 minutes 38 seconds  Total Procedure Duration: 0 hours 15 minutes 52 seconds  Estimated Blood Loss:  Estimated blood loss: none.      [facility]

## 2023-04-01 NOTE — Anesthesia Preprocedure Evaluation (Signed)
Anesthesia Evaluation  Patient identified by MRN, date of birth, ID band Patient awake    Reviewed: Allergy & Precautions, NPO status , Patient's Chart, lab work & pertinent test results  History of Anesthesia Complications Negative for: history of anesthetic complications  Airway Mallampati: III  TM Distance: >3 FB Neck ROM: full    Dental no notable dental hx.    Pulmonary sleep apnea    Pulmonary exam normal        Cardiovascular negative cardio ROS Normal cardiovascular exam     Neuro/Psych negative neurological ROS  negative psych ROS   GI/Hepatic negative GI ROS, Neg liver ROS,,,  Endo/Other  negative endocrine ROSdiabetes    Renal/GU negative Renal ROS  negative genitourinary   Musculoskeletal  (+) Arthritis ,    Abdominal   Peds  Hematology negative hematology ROS (+)   Anesthesia Other Findings Past Medical History: No date: Arthritis     Comment:  right wrist No date: Diabetes mellitus without complication (HCC) No date: History of allergic rhinitis No date: History of gout 06/13/2021: Prediabetes No date: Sleep apnea     Comment:  pt doesn't wear CPAP any longer No date: Wears glasses  Past Surgical History: No date: FRACTURE SURGERY     Comment:  leg, feet, (hardware in left leg); 6 operations total               per pt report No date: HARDWARE REMOVAL     Comment:  left leg No date: HERNIA REPAIR No date: right leg     Comment:  has rod placed and then removed No date: right shoulder surgery     Comment:  had to reattach muscle and tendons to shoulder     Reproductive/Obstetrics negative OB ROS                             Anesthesia Physical Anesthesia Plan  ASA: 2  Anesthesia Plan: General   Post-op Pain Management: Minimal or no pain anticipated   Induction: Intravenous  PONV Risk Score and Plan: 2 and Propofol infusion and TIVA  Airway  Management Planned: Natural Airway and Nasal Cannula  Additional Equipment:   Intra-op Plan:   Post-operative Plan:   Informed Consent: I have reviewed the patients History and Physical, chart, labs and discussed the procedure including the risks, benefits and alternatives for the proposed anesthesia with the patient or authorized representative who has indicated his/her understanding and acceptance.     Dental Advisory Given  Plan Discussed with: Anesthesiologist, CRNA and Surgeon  Anesthesia Plan Comments: (Patient consented for risks of anesthesia including but not limited to:  - adverse reactions to medications - risk of airway placement if required - damage to eyes, teeth, lips or other oral mucosa - nerve damage due to positioning  - sore throat or hoarseness - Damage to heart, brain, nerves, lungs, other parts of body or loss of life  Patient voiced understanding and assent.)       Anesthesia Quick Evaluation

## 2023-04-01 NOTE — Transfer of Care (Signed)
Immediate Anesthesia Transfer of Care Note  Patient: Paul Goodwin  Procedure(s) Performed: COLONOSCOPY WITH PROPOFOL  Patient Location: PACU  Anesthesia Type:MAC  Level of Consciousness: awake, alert , and oriented  Airway & Oxygen Therapy: Patient Spontanous Breathing  Post-op Assessment: Report given to RN and Post -op Vital signs reviewed and stable  Post vital signs: Reviewed and stable  Last Vitals:  Vitals Value Taken Time  BP    Temp    Pulse    Resp    SpO2      Last Pain:  Vitals:   04/01/23 0959  TempSrc: Temporal  PainSc: 0-No pain         Complications: There were no known notable events for this encounter.

## 2023-04-01 NOTE — H&P (Addendum)
Paul Repress, MD [address]  [address]  [place], [place] [address]  Main: [phone]  Fax: [phone] Pager: [phone]  Primary Care Physician:  Dana Allan, MD Primary Gastroenterologist:  Dr. Arlyss Goodwin  Pre-Procedure History & Physical: HPI:  Paul Goodwin is a 62 y.o. male is here for an colonoscopy.   Past Medical History:  Diagnosis Date   Arthritis    right wrist   Diabetes mellitus without complication (HCC)    History of allergic rhinitis    History of gout    Prediabetes 06/13/2021   Sleep apnea    pt doesn't wear CPAP any longer   Wears glasses     Past Surgical History:  Procedure Laterality Date   FRACTURE SURGERY     leg, feet, (hardware in left leg); 6 operations total per pt report   HARDWARE REMOVAL     left leg   HERNIA REPAIR     right leg     has rod placed and then removed   right shoulder surgery     had to reattach muscle and tendons to shoulder    Prior to Admission medications   Medication Sig Start Date End Date Taking? Authorizing Provider  Apple Cider Vinegar 500 MG TABS    Yes [provider]  B Complex-C-Folic Acid (SUPER B COMPLEX/FA/VIT C) TABS    Yes [provider]  Coenzyme Q10 (COQ10 PO) Take by mouth daily.   Yes [provider]  cyanocobalamin 2000 MCG tablet Take 2,000 mcg by mouth daily.   Yes [provider]  Garlic 1000 MG CAPS  07/22/17  Yes [provider]  glucose blood (ONE TOUCH TEST STRIPS) test strip Use as instructed to test blood sugar once daily E11.9 RELION PRIME STRIPS 12/04/19  Yes McLean-Scocuzza, Pasty Spillers, MD  Magnesium 250 MG TABS    Yes [provider]  metFORMIN (GLUCOPHAGE-XR) 500 MG 24 hr tablet Take 1 tablet (500 mg total) by mouth daily with breakfast. 02/04/23  Yes Dana Allan, MD  Multiple Vitamin (MULTIVITAMINS PO) Take by mouth daily.   Yes [provider]  Omega 3 1000 MG CAPS    Yes [provider]   pantoprazole (PROTONIX) 40 MG tablet Take 1 tablet (40 mg total) by mouth daily. 03/04/23  Yes Dana Allan, MD  ReliOn Ultra Thin Lancets MISC 1 Device by Does not apply route daily. Lancets approved with machine has relion machine 12/04/19  Yes McLean-Scocuzza, Pasty Spillers, MD  rosuvastatin (CRESTOR) 10 MG tablet Take 1 tablet (10 mg total) by mouth every evening. 03/04/23  Yes Dana Allan, MD  Testosterone 100 MG PLLT    Yes [provider]  TURMERIC PO Take by mouth.   Yes [provider]  UNABLE TO FIND Med Name: extra virgin olive oil 1 tablespoon   Yes [provider]  Vitamin D, Ergocalciferol, (DRISDOL) 1.25 MG (50000 UNIT) CAPS capsule Take 1 capsule (50,000 Units total) by mouth every 7 (seven) days. 03/04/23  Yes Dana Allan, MD    Allergies as of 01/30/2023   (No Known Allergies)    Family History  Problem Relation Age of Onset   Diabetes Mother    Alcohol abuse Father    Crohn's disease Sister    Arthritis Other    Colon cancer Neg Hx    Esophageal cancer Neg Hx    Stomach cancer Neg Hx    Rectal cancer Neg Hx    Dementia Neg Hx  Alzheimer's disease Neg Hx     Social History   Socioeconomic History   Marital status: Married    Spouse name: Not on file   Number of children: 0   Years of education: Not on file   Highest education level: GED or equivalent  Occupational History   Not on file  Tobacco Use   Smoking status: Never   Smokeless tobacco: Never  Vaping Use   Vaping status: Never Used  Substance and Sexual Activity   Alcohol use: No    Comment: heavily drank when younger    Drug use: Never   Sexual activity: Yes  Other Topics Concern   Not on file  Social History Narrative   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, Alcohol use: none, Drug use: none, Regular exercise: no      Lives at home with wife   Right handed   Caffeine: 2 cups/week   Social Determinants of Health   Financial Resource  Strain: Not on file  Food Insecurity: Not on file  Transportation Needs: Not on file  Physical Activity: Not on file  Stress: Not on file  Social Connections: Not on file  Intimate Partner Violence: Not on file    Review of Systems: See HPI, otherwise negative ROS  Physical Exam: BP 121/74   Pulse 66   Temp (!) 96.6 F (35.9 C) (Temporal)   Resp 11   Ht 5\' 8"  (1.727 m)   Wt 84.1 kg   SpO2 99%   BMI 28.19 kg/m  General:   Alert,  pleasant and cooperative in NAD Head:  Normocephalic and atraumatic. Neck:  Supple; no masses or thyromegaly. Lungs:  Clear throughout to auscultation.    Heart:  Regular rate and rhythm. Abdomen:  Soft, nontender and nondistended. Normal bowel sounds, without guarding, and without rebound.   Neurologic:  Alert and  oriented x4;  grossly normal neurologically.  Impression/Plan: Paul Goodwin is here for an colonoscopy to be performed for h/o adenoma colon  Risks, benefits, limitations, and alternatives regarding  colonoscopy have been reviewed with the patient.  Questions have been answered.  All parties agreeable.   Lannette Donath, MD  04/01/2023, 11:56 AM

## 2023-04-01 NOTE — Anesthesia Postprocedure Evaluation (Signed)
Anesthesia Post Note  Patient: Paul Goodwin  Procedure(s) Performed: COLONOSCOPY WITH PROPOFOL  Patient location during evaluation: Endoscopy Anesthesia Type: General Level of consciousness: awake and alert Pain management: pain level controlled Vital Signs Assessment: post-procedure vital signs reviewed and stable Respiratory status: spontaneous breathing, nonlabored ventilation, respiratory function stable and patient connected to nasal cannula oxygen Cardiovascular status: blood pressure returned to baseline and stable Postop Assessment: no apparent nausea or vomiting Anesthetic complications: no   There were no known notable events for this encounter.   Last Vitals:  Vitals:   04/01/23 1202 04/01/23 1212  BP: 123/78 (!) 140/85  Pulse: (!) 52 (!) 43  Resp: 13   Temp:    SpO2: 100% 100%    Last Pain:  Vitals:   04/01/23 1212  TempSrc:   PainSc: 0-No pain                 Louie Boston

## 2023-04-02 ENCOUNTER — Encounter: Payer: Self-pay | Admitting: Gastroenterology

## 2023-04-08 ENCOUNTER — Telehealth: Payer: Self-pay | Admitting: Internal Medicine

## 2023-04-08 NOTE — Telephone Encounter (Signed)
CPAP download has printed and placed up front for pickup. Lm for patient's spouse, Iris(DPR)

## 2023-04-08 NOTE — Telephone Encounter (Signed)
Iris wife states patient needs print out of CPAP readings for DOT physical. Patient to pick up readings at the office. Iris phone number is [phone].

## 2023-04-10 NOTE — Telephone Encounter (Signed)
Per Efraim Kaufmann, the patient's wife picked up the download yesterday.  Nothing further needed.

## 2023-04-11 DIAGNOSIS — G4733 Obstructive sleep apnea (adult) (pediatric): Secondary | ICD-10-CM | POA: Diagnosis not present

## 2023-06-10 LAB — HM DIABETES EYE EXAM

## 2023-07-15 DIAGNOSIS — G4733 Obstructive sleep apnea (adult) (pediatric): Secondary | ICD-10-CM | POA: Diagnosis not present

## 2023-09-02 DIAGNOSIS — R062 Wheezing: Secondary | ICD-10-CM | POA: Diagnosis not present

## 2023-09-02 DIAGNOSIS — R509 Fever, unspecified: Secondary | ICD-10-CM | POA: Diagnosis not present

## 2023-09-02 DIAGNOSIS — J22 Unspecified acute lower respiratory infection: Secondary | ICD-10-CM | POA: Diagnosis not present

## 2023-09-02 DIAGNOSIS — R059 Cough, unspecified: Secondary | ICD-10-CM | POA: Diagnosis not present

## 2023-09-17 ENCOUNTER — Ambulatory Visit: Admitting: Internal Medicine

## 2023-09-17 ENCOUNTER — Encounter: Payer: Self-pay | Admitting: Internal Medicine

## 2023-09-17 VITALS — BP 126/80 | HR 54 | Temp 98.2°F | Ht 68.0 in | Wt 198.6 lb

## 2023-09-17 DIAGNOSIS — G4733 Obstructive sleep apnea (adult) (pediatric): Secondary | ICD-10-CM | POA: Diagnosis not present

## 2023-09-17 NOTE — Patient Instructions (Signed)
 Recommend referral to [facility] sleep certified doctor for sleep endoscopy to assess for inspire device  Excellent Job A+ GOLD STAR!!  Continue CPAP as prescribed  Patient Instructions Continue to use CPAP every night, minimum of 4-6 hours a night.  Change equipment every 30 days or as directed by DME.  Wash your tubing with warm soap and water  daily, hang to dry. Wash humidifier portion weekly. Use bottled, distilled water  and change daily   Be aware of reduced alertness and do not drive or operate heavy machinery if experiencing this or drowsiness.  Exercise encouraged, as tolerated. Encouraged proper weight management.  Important to get eight or more hours of sleep  Limiting the use of the computer and television before bedtime.  Decrease naps during the day, so night time sleep will become enhanced.  Limit caffeine, and sleep deprivation.    Avoid Allergens and Irritants Avoid secondhand smoke Avoid SICK contacts Recommend  Masking  when appropriate Recommend Keep up-to-date with vaccinations

## 2023-09-17 NOTE — Progress Notes (Signed)
 @Patient  ID: Paul Goodwin, male    DOB: 1960/09/27, 63 y.o.   MRN: [id number]    SYNOPSIS 63 year old male seen for sleep consult January 16, 2021 to establish for sleep apnea..  Repeat sleep study October 2022 showed severe sleep apnea. 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  CPAP titration study on April 06, 2021 showed optimal control on CPAP 7 cm H2O with a small fullface mask.    CC Follow up Assessment of OSA   HPI  Follow-up assessment of OSA  Patient has underlying severe sleep apnea  Patient has been on nocturnal CPAP for several years CPAP 7 cm   Compliance report May 2025 reviewed in detail with patient Apart from power outage patient has excellent compliance Greater than 90% for days and greater than 90% for greater than 4 hours AHI significantly reduced to 1.2 Previous AHI is 38   No exacerbation at this time No evidence of heart failure at this time No evidence or signs of infection at this time No respiratory distress No fevers, chills, nausea, vomiting, diarrhea No evidence of lower extremity edema No evidence hemoptysis  Patient is having a hard time with the mask and with hosing Patient  tosses and turns does not get quality sleep Patient asking to be assessed for inspire device I recommend referral to [facility] for sleep endoscopy and assessment by ENT     No Known Allergies  Immunization History  Administered Date(s) Administered   Td 08/31/2009    Past Medical History:  Diagnosis Date   Arthritis    right wrist   Diabetes mellitus without complication (HCC)    History of allergic rhinitis    History of gout    Prediabetes 06/13/2021   Sleep apnea    pt doesn't wear CPAP any longer   Wears glasses     Tobacco History: Social History   Tobacco Use   Smoking Status Never  Smokeless Tobacco Never   Counseling given: Not Answered   Outpatient Medications Prior to Visit  Medication Sig Dispense Refill   Apple Cider Vinegar 500 MG TABS      B Complex-C-Folic Acid  (SUPER B COMPLEX/FA/VIT C) TABS      Coenzyme Q10 (COQ10 PO) Take by mouth daily.     cyanocobalamin  2000 MCG tablet Take 2,000 mcg by mouth daily.     Garlic 1000 MG CAPS      glucose blood (ONE TOUCH TEST STRIPS) test strip Use as instructed to test blood sugar once daily E11.9 RELION PRIME STRIPS 100 each 12   Magnesium 250 MG TABS      metFORMIN  (GLUCOPHAGE -XR) 500 MG 24 hr tablet Take 1 tablet (500 mg total) by mouth daily with breakfast. 90 tablet 3   Multiple Vitamin (MULTIVITAMINS PO) Take by mouth daily.     Omega 3 1000 MG CAPS      pantoprazole  (PROTONIX ) 40 MG tablet Take 1 tablet (40 mg total) by mouth daily. 30 tablet 0   ReliOn Ultra Thin Lancets MISC 1 Device by Does not apply route daily. Lancets approved with machine has relion machine 100 each 3   rosuvastatin  (CRESTOR ) 10 MG tablet Take 1 tablet (10 mg total) by mouth every evening. 90 tablet 3   Testosterone  100 MG PLLT      TURMERIC PO Take  by mouth.     UNABLE TO FIND Med Name: extra virgin olive oil 1 tablespoon     Vitamin D , Ergocalciferol , (DRISDOL ) 1.25 MG (50000 UNIT) CAPS capsule Take 1 capsule (50,000 Units total) by mouth every 7 (seven) days. 12 capsule 1   No facility-administered medications prior to visit.     BP 126/80 (BP Location: Right Arm, Patient Position: Sitting, Cuff Size: Large)   Pulse (!) 54   Temp 98.2 F (36.8 C) (Oral)   Ht 5\' 8"  (1.727 m)   Wt 198 lb 9.6 oz (90.1 kg)   SpO2 100%   BMI 30.20 kg/m       Review of Systems: Gen:  Denies  fever, sweats, chills weight loss  HEENT: Denies blurred vision, double vision, ear pain, eye pain, hearing loss, nose bleeds, sore throat Cardiac:  No dizziness, chest pain or heaviness, chest tightness,edema, No JVD Resp:    No cough, -sputum production, -shortness of breath,-wheezing, -hemoptysis,  Other:  All other systems negative   Physical Examination:   General Appearance: No distress  EYES PERRLA, EOM intact.   NECK Supple, No JVD Pulmonary: normal breath sounds, No wheezing.  CardiovascularNormal S1,S2.  No m/r/g.   Abdomen: Benign, Soft, non-tender. Neurology UE/LE 5/5 strength, no focal deficits Ext pulses intact, cap refill intact ALL OTHER ROS ARE NEGATIVE        Assessment & Plan:   63 year old pleasant white male seen today for assessment for severe sleep apnea, Patient currently on CPAP of 7 cm water  pressure previous AHI was documented as 38   Assessment of OSA Previous AHI 38 Continue CPAP as prescribed  Excellent compliance report Reviewed compliance report in detail with patient Patient definitely benefits the use of CPAP therapy as prescribed Using CPAP nightly and with naps Pressure setting is comfortable and is sleeping well. CPAP prescription 7 AHI reduced to 1.2  No evidence of acute heart failure at this time No respiratory distress No fevers, chills, nausea, vomiting, diarrhea No evidence hemoptysis  Patient Instructions Continue to use CPAP every night, minimum of 4-6 hours a night.  Change equipment every 30 days or as directed by DME.  Wash your tubing with warm soap and water  daily, hang to dry. Wash humidifier portion weekly. Use bottled, distilled water  and change daily   Be aware of reduced alertness and do not drive or operate heavy machinery if experiencing this or drowsiness.  Exercise encouraged, as tolerated. Encouraged proper weight management.  Important to get eight or more hours of sleep  Limiting the use of the computer and television before bedtime.  Decrease naps during the day, so night time sleep will become enhanced.  Limit caffeine, and sleep deprivation.  HTN, stroke, uncontrolled diabetes and heart failure are potential risk  factors.  Risk of untreated sleep apnea including cardiac arrhthymias, stroke, DM, pulm HTN.   Referral to [facility] sleep certified [facility] sleep endoscopy assessment Assessment for inspire device    MEDICATION ADJUSTMENTS/LABS AND TESTS ORDERED: Continue CPAP as prescribed Referral to [facility] for sleep endoscopy   CURRENT MEDICATIONS REVIEWED AT LENGTH WITH PATIENT TODAY   Patient  satisfied with Plan of action and management. All questions answered   Follow up 3 months   I spent a total of 45 minutes reviewing chart data, face-to-face evaluation with the patient, counseling and coordination of care as detailed above.      Lady Pier, M.D.  [facility]  Medical Director [facility]  Medical Director [facility] Cardio-Pulmonary Department

## 2023-09-24 ENCOUNTER — Telehealth: Payer: Self-pay | Admitting: Internal Medicine

## 2023-09-24 NOTE — Telephone Encounter (Signed)
 Copied from CRM #[id number]. Topic: Appointments - Scheduling Inquiry for Clinic >> Sep 17, 2023  9:56 AM Margarette Shawl wrote: Reason for CRM:   Pt was referred to [facility], to speak about eligibility for [facility] device. 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 He is avail typically 2 Mondays a month. [facility] does not have any appts for Mondays in [place] location. Scheduled pt with Dr. Linder Revere for 09/15. Please advise if this is incorrect, due to not scheduling with referred provider.  CB#  [phone] >> Sep 24, 2023  2:23 PM Evelyn Hire wrote: Left voicemail. Need to confirm if he is okay seeing Dr.  Linder Revere who is retiring, or per Dr. Fonnie Iba can see APP Al[facility] Jerry is available several Mondays in [place])

## 2023-10-15 ENCOUNTER — Ambulatory Visit: Admitting: Internal Medicine

## 2023-10-21 DIAGNOSIS — G4733 Obstructive sleep apnea (adult) (pediatric): Secondary | ICD-10-CM | POA: Diagnosis not present

## 2023-10-28 ENCOUNTER — Ambulatory Visit: Admitting: Internal Medicine

## 2023-11-20 DIAGNOSIS — G4733 Obstructive sleep apnea (adult) (pediatric): Secondary | ICD-10-CM | POA: Diagnosis not present

## 2023-12-16 ENCOUNTER — Ambulatory Visit: Admitting: Nurse Practitioner

## 2023-12-21 DIAGNOSIS — G4733 Obstructive sleep apnea (adult) (pediatric): Secondary | ICD-10-CM | POA: Diagnosis not present

## 2024-01-06 ENCOUNTER — Ambulatory Visit: Admitting: Internal Medicine

## 2024-01-20 ENCOUNTER — Ambulatory Visit
Admission: RE | Admit: 2024-01-20 | Discharge: 2024-01-20 | Disposition: A | Discharge status: Home / Self Care | Attending: Internal Medicine | Admitting: Internal Medicine

## 2024-01-20 ENCOUNTER — Ambulatory Visit
Admission: RE | Admit: 2024-01-20 | Discharge: 2024-01-20 | Disposition: A | Discharge status: Home / Self Care | Source: Ambulatory Visit | Attending: Internal Medicine | Admitting: Internal Medicine

## 2024-01-20 ENCOUNTER — Other Ambulatory Visit: Payer: BC Managed Care – PPO

## 2024-01-20 ENCOUNTER — Encounter: Payer: Self-pay | Admitting: Internal Medicine

## 2024-01-20 ENCOUNTER — Ambulatory Visit: Admitting: Internal Medicine

## 2024-01-20 ENCOUNTER — Other Ambulatory Visit: Payer: Self-pay

## 2024-01-20 VITALS — BP 124/82 | HR 64 | Temp 97.8°F | Resp 16 | Ht 68.0 in | Wt 196.3 lb

## 2024-01-20 DIAGNOSIS — Z125 Encounter for screening for malignant neoplasm of prostate: Secondary | ICD-10-CM

## 2024-01-20 DIAGNOSIS — E1169 Type 2 diabetes mellitus with other specified complication: Secondary | ICD-10-CM

## 2024-01-20 DIAGNOSIS — R2242 Localized swelling, mass and lump, left lower limb: Secondary | ICD-10-CM | POA: Diagnosis not present

## 2024-01-20 DIAGNOSIS — E785 Hyperlipidemia, unspecified: Secondary | ICD-10-CM

## 2024-01-20 DIAGNOSIS — E559 Vitamin D deficiency, unspecified: Secondary | ICD-10-CM

## 2024-01-20 DIAGNOSIS — M25572 Pain in left ankle and joints of left foot: Secondary | ICD-10-CM | POA: Insufficient documentation

## 2024-01-20 DIAGNOSIS — E538 Deficiency of other specified B group vitamins: Secondary | ICD-10-CM

## 2024-01-20 DIAGNOSIS — E1165 Type 2 diabetes mellitus with hyperglycemia: Secondary | ICD-10-CM | POA: Diagnosis not present

## 2024-01-20 DIAGNOSIS — G4733 Obstructive sleep apnea (adult) (pediatric): Secondary | ICD-10-CM

## 2024-01-20 DIAGNOSIS — Z7984 Long term (current) use of oral hypoglycemic drugs: Secondary | ICD-10-CM

## 2024-01-20 MED ORDER — METFORMIN HCL ER 500 MG PO TB24: 500.0000 mg | ORAL_TABLET | Freq: Every day | ORAL | 3 refills | Status: AC

## 2024-01-20 NOTE — Progress Notes (Signed)
 New Patient Office Visit  Subjective    Patient ID: Romario Tith, male    DOB: 15-Jun-1960  Age: 63 y.o. MRN: [id number]  CC:  Chief Complaint  Patient presents with   Establish Care    HPI Jamale Spangler presents to establish care. He is here with his wife today.   Discussed the use of AI scribe software for clinical note transcription with the patient, who gave verbal consent to proceed.  History of Present Illness Tiran Cuffe is a 62 year old male with gout and diabetes who presents with bilateral ankle swelling and pain.  He experiences significant swelling and pain in his ankles, initially starting in one ankle, improving, then moving to the other, and now recurring in the first. The pain in one ankle is sharp, resembling gout, while the other ankle feels different. 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 He has not been on allopurinol  recently.  He has diabetes, managed with metformin  500 mg extended release once daily. His last A1c was 7.1. Blood sugar levels have been slightly elevated recently, with a high of 188 mg/dL three days ago, but typically around 117 mg/dL when not feeling unwell.  He has a history of high cholesterol and was previously on rosuvastatin  but discontinued it due to side effects. He is not currently on any cholesterol medication. He uses a CPAP machine for sleep apnea. Occasional heart palpitations have improved with CoQ10 supplements. He takes various supplements including vitamin D , B12, turmeric, omega-3 fatty acids, apple cider vinegar, B complex, garlic, magnesium, and CoQ10. No recent fever. Swelling in both ankles and feet, with pain radiating up the leg to the knee. Describes the pain as sharp and sometimes electrical. Occasional heart palpitations but no recent episodes.   OSA -Following with Pulmonology -Compliant with CPAP  Diabetes, Type 2: -Last A1c 7.1%  9/24 -Medications: Metformin  XR 500 mg  -Patient is compliant with the above medications and reports no side effects.  -Checking BG at home: 120-188  -Eye Exam: Follows with [facility] - UTD -Foot exam: Due today -Microalbumin: Due -Statin: No but had been  -PNA vaccine: Discuss at follow up -Denies symptoms of hypoglycemia, polyuria, polydipsia, numbness extremities, foot ulcers/trauma.   HLD: -Medications: Had been on Crestor  but had side effects (fatigue) -Last lipid panel: Lipid Panel     Component Value Date/Time   CHOL 224 (H) 01/21/2023 1007   TRIG 124.0 01/21/2023 1007   HDL 47.40 01/21/2023 1007   CHOLHDL 5 01/21/2023 1007   VLDL 24.8 01/21/2023 1007   LDLCALC 152 (H) 01/21/2023 1007   LDLDIRECT 133.0 02/16/2019 0801   Foot/Ankle Pain: -Hx of gout, had been on Allopurinol  at some point but no longer taking -Currently having pain in left ankle, then right ankle, taking Celebrex which has helped  Health Maintenance: -Blood work due -Colon cancer screening: colonoscopy 12/24 repeat in 10 years  Outpatient Encounter Medications as of 01/20/2024  Medication Sig   Apple Cider Vinegar 500 MG TABS    B Complex-C-Folic Acid  (SUPER B COMPLEX/FA/VIT C) TABS    cholecalciferol (VITAMIN D3) 25 MCG (1000 UNIT) tablet Take 1,000 Units by mouth daily.   Coenzyme Q10 (COQ10 PO) Take by mouth daily.   cyanocobalamin  2000 MCG tablet Take 2,000 mcg by mouth daily.   Garlic 1000 MG CAPS    Magnesium 250 MG TABS    metFORMIN  (GLUCOPHAGE -XR) 500 MG 24 hr tablet Take 1  tablet (500 mg total) by mouth daily with breakfast.   Multiple Vitamin (MULTIVITAMINS PO) Take by mouth daily.   Omega 3 1000 MG CAPS    pantoprazole  (PROTONIX ) 40 MG tablet Take 1 tablet (40 mg total) by mouth daily.   Testosterone  100 MG PLLT    TURMERIC PO Take by mouth.   albuterol  (VENTOLIN  HFA) 108 (90 Base) MCG/ACT inhaler Inhale 2 puffs into the lungs every 4 (four) hours as needed. (Patient not taking:  Reported on 01/20/2024)   glucose blood (ONE TOUCH TEST STRIPS) test strip Use as instructed to test blood sugar once daily E11.9 RELION PRIME STRIPS   ReliOn Ultra Thin Lancets MISC 1 Device by Does not apply route daily. Lancets approved with machine has relion machine   rosuvastatin  (CRESTOR ) 10 MG tablet Take 1 tablet (10 mg total) by mouth every evening. (Patient not taking: Reported on 01/20/2024)   UNABLE TO FIND Med Name: extra virgin olive oil 1 tablespoon (Patient not taking: Reported on 01/20/2024)   Vitamin D , Ergocalciferol , (DRISDOL ) 1.25 MG (50000 UNIT) CAPS capsule Take 1 capsule (50,000 Units total) by mouth every 7 (seven) days. (Patient not taking: Reported on 01/20/2024)   No facility-administered encounter medications on file as of 01/20/2024.    Past Medical History:  Diagnosis Date   Arthritis    right wrist   Diabetes mellitus without complication (HCC)    History of allergic rhinitis    History of gout    Prediabetes 06/13/2021   Sleep apnea    pt doesn't wear CPAP any longer   Wears glasses     Past Surgical History:  Procedure Laterality Date   COLONOSCOPY WITH PROPOFOL  N/A 04/01/2023   Procedure: COLONOSCOPY WITH PROPOFOL ;  Surgeon: Unk Corinn Skiff, MD;  Location: [facility] ENDOSCOPY;  Service: Gastroenterology;  Laterality: N/A;   FRACTURE SURGERY     leg, feet, (hardware in left leg); 6 operations total per pt report   HARDWARE REMOVAL     left leg   HERNIA REPAIR     right leg     has rod placed and then removed   right shoulder surgery     had to reattach muscle and tendons to shoulder    Family History  Problem Relation Age of Onset   Diabetes Mother    Alcohol abuse Father    Crohn's disease Sister    Arthritis Other    Colon cancer Neg Hx    Esophageal cancer Neg Hx    Stomach cancer Neg Hx    Rectal cancer Neg Hx    Dementia Neg Hx    Alzheimer's disease Neg Hx     Social History   Socioeconomic History   Marital status: Married     Spouse name: Not on file   Number of children: 0   Years of education: Not on file   Highest education level: GED or equivalent  Occupational History   Not on file  Tobacco Use   Smoking status: Never   Smokeless tobacco: Never  Vaping Use   Vaping status: Never Used  Substance and Sexual Activity   Alcohol use: No    Comment: heavily drank when younger    Drug use: Never   Sexual activity: Yes  Other Topics Concern   Not on file  Social History Narrative   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, Alcohol use: none, Drug use: none, Regular exercise: no      Lives at home with  wife   Right handed   Caffeine: 2 cups/week   Social Drivers of Corporate investment banker Strain: Low Risk  (01/16/2024)   Overall Financial Resource Strain (CARDIA)    Difficulty of Paying Living Expenses: Not hard at all  Food Insecurity: No Food Insecurity (01/16/2024)   Hunger Vital Sign    Worried About Running Out of Food in the Last Year: Never true    Ran Out of Food in the Last Year: Never true  Transportation Needs: No Transportation Needs (01/16/2024)   PRAPARE - Administrator, Civil Service (Medical): No    Lack of Transportation (Non-Medical): No  Physical Activity: Sufficiently Active (01/16/2024)   Exercise Vital Sign    Days of Exercise per Week: 1 day    Minutes of Exercise per Session: 150+ min  Stress: No Stress Concern Present (01/16/2024)   [employer] of Occupational Health - Occupational Stress Questionnaire    Feeling of Stress: Only a little  Social Connections: Moderately Isolated (01/16/2024)   Social Connection and Isolation Panel    Frequency of Communication with Friends and Family: More than three times a week    Frequency of Social Gatherings with Friends and Family: Never    Attends Religious Services: Never    Database administrator or Organizations: No    Attends Engineer, structural: Not on file    Marital Status:  Married  Catering manager Violence: Not on file    Review of Systems  Musculoskeletal:  Positive for joint pain.        Objective    BP 124/82 (Cuff Size: Large)   Pulse 64   Temp 97.8 F (36.6 C) (Oral)   Resp 16   Ht 5' 8 (1.727 m)   Wt 196 lb 4.8 oz (89 kg)   SpO2 97%   BMI 29.85 kg/m   Physical Exam Constitutional:      Appearance: Normal appearance.  HENT:     Head: Normocephalic and atraumatic.     Mouth/Throat:     Mouth: Mucous membranes are moist.     Pharynx: Oropharynx is clear.  Eyes:     Extraocular Movements: Extraocular movements intact.     Conjunctiva/sclera: Conjunctivae normal.     Pupils: Pupils are equal, round, and reactive to light.  Cardiovascular:     Rate and Rhythm: Normal rate and regular rhythm.     Pulses:          Dorsalis pedis pulses are 2+ on the right side and 2+ on the left side.  Pulmonary:     Effort: Pulmonary effort is normal.     Breath sounds: Normal breath sounds.  Musculoskeletal:        General: Tenderness present.     Right lower leg: Edema present.     Left lower leg: No edema.     Right foot: Normal range of motion. No deformity, bunion, Charcot foot, foot drop or prominent metatarsal heads.     Left foot: Normal range of motion. Deformity present. No bunion, Charcot foot, foot drop or prominent metatarsal heads.  Feet:     Right foot:     Protective Sensation: 6 sites tested.  6 sites sensed.     Skin integrity: Skin integrity normal.     Toenail Condition: Right toenails are abnormally thick.     Left foot:     Protective Sensation: 6 sites tested.  6 sites sensed.     Skin  integrity: Skin integrity normal.     Toenail Condition: Left toenails are abnormally thick.  Skin:    General: Skin is warm and dry.     Comments: Large firm swelling, tender to palpation over left lateral malleolus, maybe tophi    Neurological:     General: No focal deficit present.     Mental Status: He is alert. Mental status is  at baseline.  Psychiatric:        Mood and Affect: Mood normal.        Behavior: Behavior normal.         Assessment & Plan:   Assessment & Plan Left foot and ankle swelling and mass, etiology undetermined (possible gout, tophus, or other) Swelling and mass in the left foot and ankle with a hard, non-fluid-filled bump. Differential includes gout, tophus, or other. X-ray needed to rule out bone involvement. - Order x-ray of the left foot and ankle. - Check uric acid and inflammatory markers.  Recurrent gout flares Recurrent gout flares with swelling and pain in both ankles. Previous allopurinol  effective but discontinued. Colchicine  difficult due to side effects. Uric acid levels may be misleading during flares. Allopurinol  indicated if flares occur two to three times a year. - Check uric acid levels. - Discuss allopurinol  for chronic management if recurrent flares confirmed. - Continue Celebrex for acute flare management, refill if necessary.  Type 2 diabetes mellitus Type 2 diabetes with A1c at 7.1% last year. Current blood sugar slightly elevated. On low dose metformin . Dietary strategies discussed. - Order A1c. - Perform urine test for proteinuria. - Ensure annual eye exam. - Diabetic foot exam today.  - Refill metformin  for one year, increase to twice daily if A1c above 7.5%.  Hyperlipidemia Hyperlipidemia with previous rosuvastatin  intolerance. Cholesterol high last year. Discussed atorvastatin  trial or Repatha if statins not tolerated. Statins primary treatment but 30% cannot tolerate. - Order lipid panel. - Consider atorvastatin  trial if cholesterol high and willing. - Discuss Repatha if statins not tolerated.  Obstructive sleep apnea, on CPAP Obstructive sleep apnea managed with CPAP. Reports issues with mask, considering implantable device. - Continue CPAP therapy. - Evaluate for implantable device eligibility.  General Health Maintenance Discussed vitamin D   supplementation and healthy diet for diabetes and hyperlipidemia management. - Check vitamin D  levels. - Encourage balanced diet with attention to carbohydrate and protein intake.  Follow-Up Follow-up plans for ongoing management discussed. - Schedule follow-up in six months to review A1c and lab results. - Contact with lab results and discuss management changes.  - CBC w/Diff/Platelet - Comprehensive Metabolic Panel (CMET) - HgB A1c - Urine Microalbumin w/creat. ratio - metFORMIN  (GLUCOPHAGE -XR) 500 MG 24 hr tablet; Take 1 tablet (500 mg total) by mouth daily with breakfast.  Dispense: 90 tablet; Refill: 3 - Lipid Profile - C-reactive protein - Sed Rate (ESR) - Uric acid - DG Foot Complete Left; Future - DG Ankle Complete Left; Future - Vitamin B12 - Vitamin D  (25 hydroxy) - PSA   Return in about 6 months (around 07/19/2024).   Sharyle Fischer, DO

## 2024-01-21 ENCOUNTER — Ambulatory Visit: Payer: Self-pay | Admitting: Internal Medicine

## 2024-01-21 ENCOUNTER — Other Ambulatory Visit: Payer: Self-pay | Admitting: Internal Medicine

## 2024-01-21 DIAGNOSIS — G4733 Obstructive sleep apnea (adult) (pediatric): Secondary | ICD-10-CM | POA: Diagnosis not present

## 2024-01-21 DIAGNOSIS — M109 Gout, unspecified: Secondary | ICD-10-CM

## 2024-01-21 LAB — LIPID PANEL
Cholesterol: 221 mg/dL — ABNORMAL HIGH (ref <200)
HDL: 59 mg/dL (ref >=40)
LDL Cholesterol (Calc): 144 mg/dL — ABNORMAL HIGH
Non-HDL Cholesterol (Calc): 162 mg/dL — ABNORMAL HIGH (ref <130)
Total CHOL/HDL Ratio: 3.7 (calc) (ref <5.0)
Triglycerides: 78 mg/dL (ref <150)

## 2024-01-21 LAB — COMPREHENSIVE METABOLIC PANEL WITH GFR
AG Ratio: 1.6 (calc) (ref 1.0–2.5)
ALT: 20 U/L (ref 9–46)
AST: 18 U/L (ref 10–35)
Albumin: 4.6 g/dL (ref 3.6–5.1)
Alkaline phosphatase (APISO): 83 U/L (ref 35–144)
BUN: 15 mg/dL (ref 7–25)
CO2: 27 mmol/L (ref 20–32)
Calcium: 9.4 mg/dL (ref 8.6–10.3)
Chloride: 105 mmol/L (ref 98–110)
Creat: 0.96 mg/dL (ref 0.70–1.35)
Globulin: 2.8 g/dL (ref 1.9–3.7)
Glucose, Bld: 139 mg/dL — ABNORMAL HIGH (ref 65–99)
Potassium: 5.3 mmol/L (ref 3.5–5.3)
Sodium: 140 mmol/L (ref 135–146)
Total Bilirubin: 0.6 mg/dL (ref 0.2–1.2)
Total Protein: 7.4 g/dL (ref 6.1–8.1)
eGFR: 90 mL/min/1.73m2 (ref >=60)

## 2024-01-21 LAB — HEMOGLOBIN A1C
Hgb A1c MFr Bld: 6.7 % — ABNORMAL HIGH (ref <5.7)
Mean Plasma Glucose: 146 mg/dL
eAG (mmol/L): 8.1 mmol/L

## 2024-01-21 LAB — CBC WITH DIFFERENTIAL/PLATELET
Absolute Lymphocytes: 1209 {cells}/uL (ref 850–3900)
Absolute Monocytes: 635 {cells}/uL (ref 200–950)
Basophils Absolute: 61 {cells}/uL (ref 0–200)
Basophils Relative: 0.7 %
Eosinophils Absolute: 261 {cells}/uL (ref 15–500)
Eosinophils Relative: 3 %
HCT: 42.7 % (ref 38.5–50.0)
Hemoglobin: 13.8 g/dL (ref 13.2–17.1)
MCH: 30.5 pg (ref 27.0–33.0)
MCHC: 32.3 g/dL (ref 32.0–36.0)
MCV: 94.5 fL (ref 80.0–100.0)
MPV: 10.7 fL (ref 7.5–12.5)
Monocytes Relative: 7.3 %
Neutro Abs: 6534 {cells}/uL (ref 1500–7800)
Neutrophils Relative %: 75.1 %
Platelets: 300 Thousand/uL (ref 140–400)
RBC: 4.52 Million/uL (ref 4.20–5.80)
RDW: 11.7 % (ref 11.0–15.0)
Total Lymphocyte: 13.9 %
WBC: 8.7 Thousand/uL (ref 3.8–10.8)

## 2024-01-21 LAB — C-REACTIVE PROTEIN: CRP: 68.1 mg/L — ABNORMAL HIGH (ref <8.0)

## 2024-01-21 LAB — URIC ACID: Uric Acid, Serum: 7.6 mg/dL (ref 4.0–8.0)

## 2024-01-21 LAB — VITAMIN D 25 HYDROXY (VIT D DEFICIENCY, FRACTURES): Vit D, 25-Hydroxy: 42 ng/mL (ref 30–100)

## 2024-01-21 LAB — SEDIMENTATION RATE: Sed Rate: 29 mm/h — ABNORMAL HIGH (ref 0–20)

## 2024-01-21 LAB — MICROALBUMIN / CREATININE URINE RATIO
Creatinine, Urine: 97 mg/dL (ref 20–320)
Microalb Creat Ratio: 12 mg/g{creat} (ref <30)
Microalb, Ur: 1.2 mg/dL

## 2024-01-21 LAB — VITAMIN B12: Vitamin B-12: 516 pg/mL (ref 200–1100)

## 2024-01-21 LAB — PSA: PSA: 0.87 ng/mL (ref <=4.00)

## 2024-01-21 MED ORDER — CELECOXIB 200 MG PO CAPS: 200.0000 mg | ORAL_CAPSULE | Freq: Two times a day (BID) | ORAL | 0 refills | Status: AC

## 2024-01-31 ENCOUNTER — Encounter: Payer: Self-pay | Admitting: Internal Medicine

## 2024-02-03 ENCOUNTER — Ambulatory Visit: Payer: BC Managed Care – PPO | Admitting: Family Medicine

## 2024-02-03 ENCOUNTER — Encounter: Admitting: Internal Medicine

## 2024-02-03 ENCOUNTER — Other Ambulatory Visit: Payer: Self-pay | Admitting: Internal Medicine

## 2024-02-03 ENCOUNTER — Encounter: Payer: BC Managed Care – PPO | Admitting: Family Medicine

## 2024-02-03 DIAGNOSIS — E7800 Pure hypercholesterolemia, unspecified: Secondary | ICD-10-CM

## 2024-02-03 MED ORDER — ROSUVASTATIN CALCIUM 10 MG PO TABS: 10.0000 mg | ORAL_TABLET | Freq: Every day | ORAL | 3 refills | Status: DC

## 2024-02-17 ENCOUNTER — Ambulatory Visit: Admitting: Pulmonary Disease

## 2024-02-17 ENCOUNTER — Encounter: Payer: Self-pay | Admitting: Pulmonary Disease

## 2024-02-17 VITALS — BP 148/89 | HR 49 | Ht 68.0 in | Wt 196.0 lb

## 2024-02-17 DIAGNOSIS — G4733 Obstructive sleep apnea (adult) (pediatric): Secondary | ICD-10-CM

## 2024-02-17 NOTE — Patient Instructions (Addendum)
 Referral to Dr. Tobie of ENT for evaluation for an inspire device - As we discussed-inspire device is for CPAP failure  We may need to repeat your sleep study to make sure you do not have central sleep apnea-usually a study should be within a couple of years  Continue using your CPAP as is  Download from your machine shows it is working well  Call us  with significant concerns  Follow-up in 6 months

## 2024-02-17 NOTE — Progress Notes (Signed)
 Paul Goodwin    [id number]    March 02, 1961  Primary Care Physician:Andrews, Sharyle, DO  Referring Physician: Isaiah Scrivener, MD [address] [place],  [place] [address]  Chief complaint:   Patient being seen for evaluation for obstructive sleep apnea, ongoing difficulties with using CPAP, evaluation for an inspire device  HPI:  Patient with severe obstructive sleep apnea titrated CPAP therapy, has been using CPAP Tries to use CPAP nightly Ongoing difficulties include not being able to keep it on for most of the night, tries to wait for about 4 hours to take it off so that he remains compliant but otherwise wakes up multiple times from not being able to keep the mask on The mask leaks, getting caught in the hose 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, spends multiple days away Sometimes not able to get his CPAP to work during his travels [place] he is not able to continue using CPAP as it does not seem to be helping him at all  He is considering the inspire device as an option of treatment because of his ongoing difficulties with tolerating CPAP therapy  Sleep study from 2022 shows an AHI of 37.2 with an O2 nadir of 77% titrated to CPAP of 7 which he has been using  He has a history of nasal polyposis  History of prediabetes, allergic rhinitis  Never smoker   Outpatient Encounter Medications as of 02/17/2024  Medication Sig   Apple Cider Vinegar 500 MG TABS    B Complex-C-Folic Acid  (SUPER B COMPLEX/FA/VIT C) TABS    cholecalciferol (VITAMIN D3) 25 MCG (1000 UNIT) tablet Take 1,000 Units by mouth daily.   Coenzyme Q10 (COQ10 PO) Take by mouth daily.   cyanocobalamin  2000 MCG tablet Take 2,000 mcg by mouth daily.   Garlic 1000 MG CAPS    glucose blood (ONE TOUCH TEST STRIPS) test strip Use as instructed to test blood sugar once daily E11.9 RELION PRIME STRIPS   Magnesium 250 MG TABS    metFORMIN  (GLUCOPHAGE -XR) 500 MG 24 hr tablet Take 1  tablet (500 mg total) by mouth daily with breakfast.   Multiple Vitamin (MULTIVITAMINS PO) Take by mouth daily.   Omega 3 1000 MG CAPS    ReliOn Ultra Thin Lancets MISC 1 Device by Does not apply route daily. Lancets approved with machine has relion machine   rosuvastatin  (CRESTOR ) 10 MG tablet Take 1 tablet (10 mg total) by mouth daily.   Testosterone  100 MG PLLT    TURMERIC PO Take by mouth.   No facility-administered encounter medications on file as of 02/17/2024.    Allergies as of 02/17/2024   (No Known Allergies)    Past Medical History:  Diagnosis Date   Arthritis    right wrist   Diabetes mellitus without complication (HCC)    History of allergic rhinitis    History of gout    Prediabetes 06/13/2021   Sleep apnea    pt doesn't wear CPAP any longer   Wears glasses     Past Surgical History:  Procedure Laterality Date   COLONOSCOPY WITH PROPOFOL  N/A 04/01/2023   Procedure: COLONOSCOPY WITH PROPOFOL ;  Surgeon: Unk Corinn Skiff, MD;  Location: [facility] ENDOSCOPY;  Service: Gastroenterology;  Laterality: N/A;   FRACTURE SURGERY     leg, feet, (hardware in left leg); 6 operations total per pt report   HARDWARE REMOVAL     left leg   HERNIA REPAIR  right leg     has rod placed and then removed   right shoulder surgery     had to reattach muscle and tendons to shoulder    Family History  Problem Relation Age of Onset   Diabetes Mother    Alcohol abuse Father    Crohn's disease Sister    Arthritis Other    Colon cancer Neg Hx    Esophageal cancer Neg Hx    Stomach cancer Neg Hx    Rectal cancer Neg Hx    Dementia Neg Hx    Alzheimer's disease Neg Hx     Social History   Socioeconomic History   Marital status: Married    Spouse name: Not on file   Number of children: 0   Years of education: Not on file   Highest education level: GED or equivalent  Occupational History   Not on file  Tobacco Use   Smoking status: Never   Smokeless tobacco: Never   Vaping Use   Vaping status: Never Used  Substance and Sexual Activity   Alcohol use: No    Comment: heavily drank when younger    Drug use: Never   Sexual activity: Yes  Other Topics Concern   Not on file  Social History Narrative   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, Alcohol use: none, Drug use: none, Regular exercise: no      Lives at home with wife   Right handed   Caffeine: 2 cups/week   Social Drivers of Corporate Investment Banker Strain: Low Risk  (01/16/2024)   Overall Financial Resource Strain (CARDIA)    Difficulty of Paying Living Expenses: Not hard at all  Food Insecurity: No Food Insecurity (01/16/2024)   Hunger Vital Sign    Worried About Running Out of Food in the Last Year: Never true    Ran Out of Food in the Last Year: Never true  Transportation Needs: No Transportation Needs (01/16/2024)   PRAPARE - Administrator, Civil Service (Medical): No    Lack of Transportation (Non-Medical): No  Physical Activity: Sufficiently Active (01/16/2024)   Exercise Vital Sign    Days of Exercise per Week: 1 day    Minutes of Exercise per Session: 150+ min  Stress: No Stress Concern Present (01/16/2024)   [employer] of Occupational Health - Occupational Stress Questionnaire    Feeling of Stress: Only a little  Social Connections: Moderately Isolated (01/16/2024)   Social Connection and Isolation Panel    Frequency of Communication with Friends and Family: More than three times a week    Frequency of Social Gatherings with Friends and Family: Never    Attends Religious Services: Never    Database Administrator or Organizations: No    Attends Engineer, Structural: Not on file    Marital Status: Married  Catering Manager Violence: Not on file    Review of Systems  Respiratory:  Positive for apnea. Negative for shortness of breath.   Psychiatric/Behavioral:  Positive for sleep disturbance.     Vitals:   02/17/24 1002   BP: (!) 148/89  Pulse: (!) 49  SpO2: 100%     Physical Exam Constitutional:      Appearance: Normal appearance.  HENT:     Head: Normocephalic.     Mouth/Throat:     Mouth: Mucous membranes are moist.  Eyes:     Pupils: Pupils are equal, round, and reactive to light.  Cardiovascular:     Rate and Rhythm: Normal rate.     Heart sounds: No murmur heard.    No friction rub.  Pulmonary:     Effort: No respiratory distress.     Breath sounds: No stridor. No wheezing or rhonchi.  Musculoskeletal:     Cervical back: No rigidity or tenderness.  Neurological:     Mental Status: He is alert.  Psychiatric:        Mood and Affect: Mood normal.     Data Reviewed: Patient's previous sleep study was reviewed  Most recent compliance data reviewed showing 83% compliance Average use on days used of 5 hours 56 minutes CPAP of 7 Residual AHI 1.3  Assessment/Plan: Patient with obstructive sleep apnea, on CPAP therapy - Having difficulty with continuing with CPAP therapy - He does have mask issues, hose issues, difficulty with using CPAP on a nightly basis because of his constant traveling and being in his truck for work - He feels he is not benefiting from CPAP at the present time and really struggling with trying to maintain compliance with just keeping it on for 4 hours rather than actually feeling better with using it  Discussed an inspire device as a treatment for sleep apnea in CPAP failure, not necessarily just an option for not liking CPAP  Risks and benefits discussed cost  His sleep study is over 24 months ago, may require a repeat sleep study to ascertain he does not have central sleep apnea  Encouraged to continue using his CPAP at present  Will place a referral to Dr. Tobie of ENT for evaluation for an inspire device  His CPAP intolerance is not related to a mask or pressure issue  I did let him know that other issues with his sleep and related to sleep disordered  breathing will also not be addressed by an inspire device  Follow-up in 6 months    Jennet Epley MD [facility] 02/17/2024, 10:09 AM  CC: Kasa, Kurian, MD

## 2024-03-02 ENCOUNTER — Encounter: Payer: Self-pay | Admitting: Internal Medicine

## 2024-03-02 ENCOUNTER — Ambulatory Visit: Admitting: Pulmonary Disease

## 2024-03-06 ENCOUNTER — Ambulatory Visit: Payer: Self-pay

## 2024-03-06 NOTE — Telephone Encounter (Addendum)
 FYI Only or Action Required?: Action required by provider: request for appointment. 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   Patient was last seen in primary care on 01/20/2024 by Bernardo Fend, DO.  Called Nurse Triage reporting Joint Swelling.  Symptoms began several months ago.  Interventions attempted: Prescription medications: Celebrex.  Symptoms are: stable.  Triage Disposition: See PCP Within 2 Weeks  This RN educated pt on home care, new-worsening symptoms, when to call back/seek emergent care. Pt verbalized understanding and agrees to plan.   Patient/caregiver understands and will follow disposition?: Yes     Copied from CRM [id number]. Topic: Clinical - Red Word Triage >> Mar 06, 2024  8:31 AM Antwanette L wrote: Red Word that prompted transfer to Nurse Triage: Iris, the patient's wife, is calling because the patient is experiencing inflammation in his ankle, accompanied by swelling and pain. Reason for Disposition  [1] MILD swelling of one ankle AND [2] is a chronic symptom (recurrent or ongoing AND present > 4 weeks)  Answer Assessment - Initial Assessment Questions 1. LOCATION: Which ankle is swollen? Where is the swelling?     Left ankle currently 2. ONSET: When did the swelling start?     Ongoing, intermittent ankle swelling 3. SWELLING: How bad is the swelling? Or, How large is it? (e.g., mild, moderate, severe; size of localized swelling)      Pretty swollen per wife 4. PAIN: Is there any pain? If Yes, ask: How bad is it? (Scale 0-10; or none, mild, moderate, severe)     Mod-Severe 5. CAUSE: What do you think caused the ankle swelling?     Unknown, not resolving 6. OTHER SYMPTOMS: Do you have any other symptoms? (e.g., fever, chest pain, difficulty breathing, calf pain)     Denies SOB, CP, calf pain/swelling  Protocols used: Ankle Swelling-A-AH

## 2024-03-06 NOTE — Telephone Encounter (Signed)
 Scheduled with Dr Bernardo for Monday 11/24

## 2024-03-16 ENCOUNTER — Ambulatory Visit: Admitting: Internal Medicine

## 2024-03-16 VITALS — BP 122/78 | HR 60 | Temp 97.8°F | Resp 18 | Ht 68.0 in | Wt 196.0 lb

## 2024-03-16 DIAGNOSIS — E7800 Pure hypercholesterolemia, unspecified: Secondary | ICD-10-CM

## 2024-03-16 DIAGNOSIS — M79672 Pain in left foot: Secondary | ICD-10-CM

## 2024-03-16 DIAGNOSIS — R32 Unspecified urinary incontinence: Secondary | ICD-10-CM

## 2024-03-16 DIAGNOSIS — W57XXXS Bitten or stung by nonvenomous insect and other nonvenomous arthropods, sequela: Secondary | ICD-10-CM

## 2024-03-16 DIAGNOSIS — M79671 Pain in right foot: Secondary | ICD-10-CM

## 2024-03-16 DIAGNOSIS — Z8261 Family history of arthritis: Secondary | ICD-10-CM

## 2024-03-16 DIAGNOSIS — M25572 Pain in left ankle and joints of left foot: Secondary | ICD-10-CM | POA: Diagnosis not present

## 2024-03-16 DIAGNOSIS — M25511 Pain in right shoulder: Secondary | ICD-10-CM | POA: Diagnosis not present

## 2024-03-16 DIAGNOSIS — M898X7 Other specified disorders of bone, ankle and foot: Secondary | ICD-10-CM

## 2024-03-16 MED ORDER — CELECOXIB 50 MG PO CAPS: 50.0000 mg | ORAL_CAPSULE | Freq: Two times a day (BID) | ORAL | 1 refills | Status: AC | PRN

## 2024-03-16 MED ORDER — ATORVASTATIN CALCIUM 10 MG PO TABS: 10.0000 mg | ORAL_TABLET | Freq: Every day | ORAL | 1 refills | Status: AC

## 2024-03-16 NOTE — Progress Notes (Signed)
 Acute Office Visit  Subjective:     Patient ID: Paul Goodwin, male    DOB: [birth year], 63 y.o.   MRN: [id number]  Chief Complaint  Patient presents with   Joint Swelling    Especially in feet    HPI Patient is in today for joint pain and swelling. He is here with his sister.   Discussed the use of AI scribe software for clinical note transcription with the patient, who gave verbal consent to proceed.  History of Present Illness Paul Goodwin is a 63 year old male with gout who presents with bilateral foot pain and swelling. He is accompanied by his sister, Paul Goodwin, a retired engineer, civil (consulting).  He reports persistent bilateral ankle and foot pain and swelling. Symptoms began in the left ankle, then involved the right shoulder and right knee before localizing to both ankles and feet. Pain in other joints has been transient, but the ankles and feet remain painful and swollen. He notes elevated ESR and CRP with normal uric acid from recent labs and recalls an x-ray showing a bony growth on his foot.  He is concerned about inflammatory arthritis given his sister's rheumatoid arthritis. He reports multiple prior tick bites, with the most recent 3 weeks ago, and has never been treated for Lyme disease.  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  He previously took Crestor  for high cholesterol but stopped it after developing urinary and fecal incontinence, which resolved when he discontinued the medication. He is not taking any cholesterol medication currently and has a strong family history of high cholesterol.  He takes three ibuprofen  once daily for pain and is worried about long-term kidney effects. He has not used other prescribed pain medications recently.   The 10-year ASCVD risk score (Arnett DK, et al., 2019) is: 15.6%   Values used to calculate the score:     Age: 57 years      Clincally relevant sex: Male     Is Non-Hispanic African American: No     Diabetic: Yes     Tobacco smoker: No     Systolic Blood Pressure: 122 mmHg     Is BP treated: No     HDL Cholesterol: 59 mg/dL     Total Cholesterol: 221 mg/dL    Review of Systems  Musculoskeletal:  Positive for joint pain. Negative for myalgias.        Objective:    BP 122/78   Pulse 60   Temp 97.8 F (36.6 C)   Resp 18   Ht 5' 8 (1.727 m)   Wt 196 lb (88.9 kg)   SpO2 97%   BMI 29.80 kg/m    Physical Exam Constitutional:      Appearance: Normal appearance.  HENT:     Head: Normocephalic and atraumatic.  Eyes:     Conjunctiva/sclera: Conjunctivae normal.  Cardiovascular:     Rate and Rhythm: Normal rate and regular rhythm.     Pulses:          Dorsalis pedis pulses are 2+ on the right side and 2+ on the left side.  Pulmonary:     Effort: Pulmonary effort is normal.     Breath sounds: Normal breath sounds.  Musculoskeletal:     Left foot: Deformity present.  Skin:    General: Skin is warm and dry.     Comments: No rash or evidence  of tick bite  Neurological:     General: No focal deficit present.     Mental Status: He is alert. Mental status is at baseline.  Psychiatric:        Mood and Affect: Mood normal.        Behavior: Behavior normal.     No results found for any visits on 03/16/24.      Assessment & Plan:   Assessment & Plan Polyarticular joint pain and swelling, suspect autoimmune etiology Chronic bilateral foot pain and swelling with intermittent right shoulder and knee involvement. Normal uric acid, elevated ESR and CRP. Differential includes RA and Lyme disease. Family history of RA. Recent tick bite raises suspicion for Lyme disease. Positive ANA requires further testing. Rheumatology referral requires strong lab evidence due to long wait times. - Ordered ANA, rheumatoid factor, and Lyme disease serology. - Rechecked ESR and CRP. - Referred to rheumatology if  ANA and rheumatoid factor are positive. - Prescribed Celebrex  50 mg twice daily as needed for pain, with food, and instructed to avoid other NSAIDs.  Exostosis of left foot Bony growth on left foot contributing to swelling and pain. Previous surgeries noted. - Referred to podiatry for evaluation and management.  Pure hypercholesterolemia Familial hypercholesterolemia with previous statin intolerance. Current cholesterol 144 mg/dL. Cardiovascular risk 16% over ten years. Insurance coverage for Repatha and Praluent uncertain. Lipitor considered due to Crestor  intolerance. - Discontinued Crestor  due to side effects. Will add TSH to rule out metabolic disease contributing to incontinence.  - Prescribed Lipitor 10 mg daily. 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 - Added thyroid  function test to labs.  Recent tick bite, possible Lyme disease exposure Recent tick bite three weeks ago. No current Lyme disease symptoms. Lyme disease serology ordered to rule out infection. - Ordered Lyme disease serology.  - Ambulatory referral to Podiatry - celecoxib  (CELEBREX ) 50 MG capsule; Take 1 capsule (50 mg total) by mouth 2 (two) times daily as needed for pain.  Dispense: 60 capsule; Refill: 1 - Rheumatoid Factor - Antinuclear Antib (ANA) - C-reactive protein - Sed Rate (ESR) - B. Burgdorfi Antibodies - atorvastatin  (LIPITOR) 10 MG tablet; Take 1 tablet (10 mg total) by mouth daily.  Dispense: 90 tablet; Refill: 1 - TSH  Return for already scheduled.  Sharyle Fischer, DO

## 2024-03-18 ENCOUNTER — Ambulatory Visit: Payer: Self-pay | Admitting: Internal Medicine

## 2024-03-20 LAB — ANTI-NUCLEAR AB-TITER (ANA TITER): ANA Titer 1: 1:40 {titer} — ABNORMAL HIGH

## 2024-03-20 LAB — ANA: Anti Nuclear Antibody (ANA): POSITIVE — AB

## 2024-03-20 LAB — B. BURGDORFI ANTIBODIES: B burgdorferi Ab IgG+IgM: <0.9 {index}

## 2024-03-20 LAB — RHEUMATOID FACTOR: Rheumatoid fact SerPl-aCnc: <10 [IU]/mL (ref <14)

## 2024-03-20 LAB — SEDIMENTATION RATE: Sed Rate: 31 mm/h — ABNORMAL HIGH (ref 0–20)

## 2024-03-20 LAB — C-REACTIVE PROTEIN: CRP: 67.2 mg/L — ABNORMAL HIGH (ref <8.0)

## 2024-03-20 LAB — TSH: TSH: 1.24 m[IU]/L (ref 0.40–4.50)

## 2024-04-13 ENCOUNTER — Encounter: Payer: Self-pay | Admitting: Podiatry

## 2024-04-13 ENCOUNTER — Ambulatory Visit

## 2024-04-13 ENCOUNTER — Ambulatory Visit: Admitting: Podiatry

## 2024-04-13 VITALS — Ht 68.0 in | Wt 196.0 lb

## 2024-04-13 DIAGNOSIS — M21611 Bunion of right foot: Secondary | ICD-10-CM

## 2024-04-13 DIAGNOSIS — M2011 Hallux valgus (acquired), right foot: Secondary | ICD-10-CM | POA: Diagnosis not present

## 2024-04-13 DIAGNOSIS — M25372 Other instability, left ankle: Secondary | ICD-10-CM | POA: Diagnosis not present

## 2024-04-13 DIAGNOSIS — M19072 Primary osteoarthritis, left ankle and foot: Secondary | ICD-10-CM

## 2024-04-13 DIAGNOSIS — M79671 Pain in right foot: Secondary | ICD-10-CM

## 2024-04-13 NOTE — Patient Instructions (Signed)
                         Contains text generated by [employer].                                 Contains text generated by [employer].

## 2024-04-13 NOTE — Progress Notes (Signed)
 "  Subjective:  Patient ID: Paul Goodwin, male    DOB: [birth year],  MRN: [id number]  Chief Complaint  Patient presents with   Foot Pain    Rm 6 Patient is here for bilateral foot pain. Pt states pain has been present for several years. Pt states breaking multiple bones in left foot, pain throughout foot. Pt states pain right foot located in bunion area.    Discussed the use of AI scribe software for clinical note transcription with the patient, who gave verbal consent to proceed.  History of Present Illness Paul Goodwin is a 63 year old male with left ankle arthritis, left hallux MTP fusion, and right hallux valgus who presents for evaluation of chronic bilateral foot and ankle pain.  Chronic bilateral foot and ankle pain is the primary concern, with symptoms alternating sides but currently most pronounced in the left ankle and foot. Left ankle pain is characterized by soreness, locking, stiffness, and episodes of decreased control and impaired balance. Pain radiates from the ankle into the foot. He experiences muscle twitches and cramps in the lower extremities, which have increased in frequency with age and occasionally require nocturnal stretching for relief. He denies neuropathy or back pain. He has difficulty balancing on the left foot, particularly due to inability to plantarflex the hallux following prior fusion.  He has a history of significant trauma to the left foot in youth, resulting in fractures of all bones and subsequent surgical intervention, including joint removal, pinning, and fusion of the left hallux MTP joint. He also sustained prior injury to the right leg with severe soft tissue damage but no fracture. He has not undergone prior joint injections. 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 He also takes  vitamins and preloads his medications for extended work trips.  Right foot symptoms include progressive hallux valgus with crowding of the big toe, development of a large medial bunion, and tenderness. The hallux impinges on adjacent toes, causing soreness and pain across the forefoot. He experiences intermittent paresthesias in the right foot. He has tried a bunion splint with strap, which provided no lasting benefit, and wears shoes sized for the left foot, resulting in excess room on the right. Compression slip-on ankle supports help during flares but can become uncomfortably tight and restrict circulation. He has not used toe spacers or bunion shields. He has developed hammer toes and increased pressure on the lateral aspect of the right foot, likely due to altered gait to avoid bunion pain.  He has also noticed worsening cramps and muscle twitches in his hands and arms.      Objective:    Physical Exam VASCULAR: DP and PT pulse palpable. Foot is warm and well-perfused. Capillary fill time is brisk. DERMATOLOGIC: Normal skin turgor, texture, and temperature. No open lesions, rashes, or ulcerations. NEUROLOGIC: Normal sensation to light touch and pressure. No paresthesias on examination. ORTHOPEDIC: Moderate to severe hallux valgus deformity on right foot with large medial bunion. Good range of motion of MTP joint. No pain in midrange of motion. Semi-reducible hamstring contractures. Swelling and tenderness around left subtalar joint, lateral ankle, and anterior joint line. Hammer toes developing. Smooth pain-free range of motion of all examined joints. No ecchymosis or bruising. No gross deformity. No pain to palpation.   No images are attached to the encounter.  Results Radiology Bilateral foot radiographs (04/13/2024): Left foot with prior hallux MTP fusion, hallux shortening and dorsiflexion; moderate to severe right hallux valgus deformity with large medial bunion; moderate to severe  left tibiotalar arthritis; left ankle varus deformity (Independently interpreted) Left ankle radiographs (non-weight bearing) (01/2024): Ankle arthritis and varus deformity (Independently interpreted)   Assessment:   1. Ankle instability, left   2. Arthritis of ankle, left   3. Hallux valgus with bunions, right      Plan:  Patient was evaluated and treated and all questions answered.  Assessment and Plan Assessment & Plan Left ankle arthritis with instability Chronic, post-traumatic left ankle arthritis with instability, likely secondary to prior significant injury and ligamentous damage. He experiences progressive pain, stiffness, and intermittent loss of balance. Radiographs demonstrate moderate to severe tibiotalar arthritis with varus deformity. Symptoms are currently managed with medication, but may require surgical intervention if he progresses or becomes disabling, particularly after retirement. Risks of surgery include prolonged recovery and impact on work. Non-surgical management remains appropriate at this time. - Reviewed current and prior radiographs. - Discussed diagnosis and post-traumatic etiology. - Recommended continued use of Celebrex  for symptomatic relief. - Discussed option of steroid injection for future flares if symptoms worsen. - Discussed use of ankle bracing for additional support, including lace-up or custom-molded options. - Advised continued use of compression sleeves as tolerated. - Instructed to return for evaluation if symptoms worsen or become unmanageable. - Discussed surgical options (ankle fusion or replacement) for future consideration if symptoms progress or become disabling, with attention to risks of prolonged recovery and work impact.  Right hallux valgus with bunion and hammer toes Chronic right hallux valgus deformity with prominent bunion and developing hammer toes, resulting in intermittent pain, digital crowding, and footwear difficulty. No  significant arthritis or pain on range of motion currently. Symptoms are manageable and do not warrant surgical intervention at this time. Surgical options, including osteotomy, realignment, or fusion, were discussed for future consideration if symptoms worsen or functional limitation increases. Risks of surgery include prolonged recovery and time off work, which is not feasible currently. Non-surgical management remains appropriate. - Discussed diagnosis and hereditary nature of the condition. - Advised on appropriate footwear with adequate room to minimize pressure on the bunion and toes. - Discussed non-surgical options including toe spacers and bunion shields for symptomatic relief. - Reviewed limited efficacy of bunion splints/straps and advised against their use for correction. - Discussed surgical options (osteotomy, realignment, or fusion) for future consideration, but deferred due to current symptom severity and work requirements. - Advised to return if pain or functional limitation increases. - Discussed that steroid injections could be considered for symptomatic relief if pain becomes more severe and surgery is not yet desired.      Return if symptoms worsen or fail to improve.   "

## 2024-05-10 ENCOUNTER — Encounter: Payer: Self-pay | Admitting: Podiatry

## 2024-05-11 ENCOUNTER — Ambulatory Visit: Admitting: Podiatry

## 2024-05-11 DIAGNOSIS — M19072 Primary osteoarthritis, left ankle and foot: Secondary | ICD-10-CM

## 2024-05-13 NOTE — Progress Notes (Signed)
"  °  Subjective:  Patient ID: Paul Goodwin, male    DOB: 07-24-1960,  MRN: [id number]  Chief Complaint  Patient presents with   Foot Pain    Left foot/ankle pain x 4 days(requesting injection). He states the Celebrex  helped but not any more.     Discussed the use of AI scribe software for clinical note transcription with the patient, who gave verbal consent to proceed.  History of Present Illness Paul Goodwin is a 64 year old male with left ankle arthritis, left hallux MTP fusion, and right hallux valgus who presents for evaluation of chronic bilateral foot and ankle pain.  He returns today for follow-up, his ankle has continued pain      Objective:    Physical Exam VASCULAR: DP and PT pulse palpable. Foot is warm and well-perfused. Capillary fill time is brisk. DERMATOLOGIC: Normal skin turgor, texture, and temperature. No open lesions, rashes, or ulcerations. NEUROLOGIC: Normal sensation to light touch and pressure. No paresthesias on examination. ORTHOPEDIC: Moderate to severe hallux valgus deformity on right foot with large medial bunion. Good range of motion of MTP joint. No pain in midrange of motion. Semi-reducible hamstring contractures. Swelling and tenderness around left subtalar joint, lateral ankle, and anterior joint line. Hammer toes developing. Smooth pain-free range of motion of all examined joints. No ecchymosis or bruising. No gross deformity. No pain to palpation.   No images are attached to the encounter.    Results Radiology Bilateral foot radiographs (04/13/2024): Left foot with prior hallux MTP fusion, hallux shortening and dorsiflexion; moderate to severe right hallux valgus deformity with large medial bunion; moderate to severe left tibiotalar arthritis; left ankle varus deformity (Independently interpreted) Left ankle radiographs (non-weight bearing) (01/2024): Ankle arthritis and varus deformity (Independently interpreted)   Assessment:   No  diagnosis found.    Plan:  Patient was evaluated and treated and all questions answered.  Assessment and Plan Assessment & Plan Left ankle arthritis with instability Chronic, post-traumatic left ankle arthritis with instability, likely secondary to prior significant injury and ligamentous damage. He experiences progressive pain, stiffness, and intermittent loss of balance. Radiographs demonstrate moderate to severe tibiotalar arthritis with varus deformity. Symptoms are currently managed with medication, but may require surgical intervention if he progresses or becomes disabling, particularly after retirement. Risks of surgery include prolonged recovery and impact on work. Non-surgical management remains appropriate at this time. - He returns today for follow-up interested in corticosteroid injection.  Celebrex  only mildly relieved his symptoms.  We discussed the risks and benefits of corticosteroid injection including the risk of impact on his blood sugar.  Following consent and prep of Betadine the left ankle.  The left ankle through a medial gutter approach was injected with 20 mg of Kenalog, 4 mg of dexamethasone and 5 mg of Marcaine.  He tolerated the procedure well.  If symptoms do not improve greater than 3 months would recommendMRI to evaluate      No follow-ups on file.   "

## 2024-07-20 ENCOUNTER — Ambulatory Visit: Admitting: Internal Medicine
